# Patient Record
Sex: Female | Born: 1945 | Race: Black or African American | Hispanic: No | Smoking: Current every day smoker
Health system: Southern US, Community
[De-identification: ages and names within clinical notes are randomized; demographics above are authoritative.]

## PROBLEM LIST (undated history)

## (undated) DIAGNOSIS — D649 Anemia, unspecified: Secondary | ICD-10-CM

## (undated) DIAGNOSIS — I10 Essential (primary) hypertension: Secondary | ICD-10-CM

## (undated) DIAGNOSIS — J45909 Unspecified asthma, uncomplicated: Secondary | ICD-10-CM

## (undated) DIAGNOSIS — J449 Chronic obstructive pulmonary disease, unspecified: Secondary | ICD-10-CM

## (undated) DIAGNOSIS — E119 Type 2 diabetes mellitus without complications: Secondary | ICD-10-CM

## (undated) DIAGNOSIS — M1990 Unspecified osteoarthritis, unspecified site: Secondary | ICD-10-CM

## (undated) HISTORY — DX: Chronic obstructive pulmonary disease, unspecified: J44.9

## (undated) HISTORY — PX: BACK SURGERY: SHX140

## (undated) HISTORY — PX: KNEE ARTHROSCOPY: SUR90

## (undated) HISTORY — PX: FOOT SURGERY: SHX648

## (undated) HISTORY — DX: Type 2 diabetes mellitus without complications: E11.9

## (undated) HISTORY — PX: EYE SURGERY: SHX253

## (undated) HISTORY — DX: Essential (primary) hypertension: I10

## (undated) HISTORY — DX: Unspecified asthma, uncomplicated: J45.909

---

## 2014-09-05 ENCOUNTER — Ambulatory Visit: Payer: Medicare Other | Attending: Orthopedic Surgery | Admitting: Rehabilitation

## 2014-09-05 DIAGNOSIS — M542 Cervicalgia: Secondary | ICD-10-CM | POA: Insufficient documentation

## 2014-09-05 DIAGNOSIS — M5416 Radiculopathy, lumbar region: Secondary | ICD-10-CM | POA: Insufficient documentation

## 2014-09-05 DIAGNOSIS — G8929 Other chronic pain: Secondary | ICD-10-CM | POA: Insufficient documentation

## 2014-09-05 DIAGNOSIS — R293 Abnormal posture: Secondary | ICD-10-CM | POA: Insufficient documentation

## 2014-09-06 ENCOUNTER — Ambulatory Visit: Payer: Medicare Other | Admitting: Rehabilitation

## 2014-09-06 ENCOUNTER — Encounter: Payer: Self-pay | Admitting: Rehabilitation

## 2014-09-06 DIAGNOSIS — M5416 Radiculopathy, lumbar region: Secondary | ICD-10-CM

## 2014-09-06 DIAGNOSIS — R293 Abnormal posture: Secondary | ICD-10-CM

## 2014-09-06 DIAGNOSIS — M542 Cervicalgia: Principal | ICD-10-CM

## 2014-09-06 DIAGNOSIS — G8929 Other chronic pain: Secondary | ICD-10-CM | POA: Diagnosis not present
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
## 2014-09-13 ENCOUNTER — Ambulatory Visit: Payer: Medicare Other | Attending: Orthopedic Surgery | Admitting: Rehabilitation

## 2014-09-13 ENCOUNTER — Encounter: Payer: Self-pay | Admitting: Rehabilitation

## 2014-09-13 DIAGNOSIS — R293 Abnormal posture: Secondary | ICD-10-CM | POA: Insufficient documentation

## 2014-09-13 DIAGNOSIS — G8929 Other chronic pain: Secondary | ICD-10-CM

## 2014-09-13 DIAGNOSIS — M5416 Radiculopathy, lumbar region: Secondary | ICD-10-CM | POA: Insufficient documentation

## 2014-09-13 DIAGNOSIS — M542 Cervicalgia: Secondary | ICD-10-CM | POA: Diagnosis not present

## 2014-09-13 NOTE — Therapy (Signed)
[facility] [address]  [address] [place], [place], [address] Phone: [phone]   Fax:  [phone]  Physical Therapy Treatment  Patient Details  Name: Jaleena Viviani MRN: [id number] Date of Birth: [birth year] Referring Provider:  Myrene Galas, MD  Encounter Date: 09/13/2014      PT End of Session - 09/13/14 1145    Visit Number 2   Number of Visits 10   Date for PT Re-Evaluation 10/11/14   PT Start Time 1100   PT Stop Time 1155   PT Time Calculation (min) 55 min   Activity Tolerance Patient limited by pain      Past Medical History  Diagnosis Date  . Hypertension   . Diabetes mellitus without complication   . COPD (chronic obstructive pulmonary disease)   . Asthma     History reviewed. No pertinent past surgical history.  There were no vitals filed for this visit.  Visit Diagnosis:  Neck pain, chronic  Right lumbar radiculopathy  Bad posture      Subjective Assessment - 09/13/14 1059    Subjective "it has been a bad week" The exercises make the neck feel worse and the leg is really hurting.  Would like to look at leg and back today   Currently in Pain? Yes   Pain Score 8    Pain Location Neck   Aggravating Factors  turning the neck, using the arms   Pain Relieving Factors muscle relaxers   Multiple Pain Sites Yes   Pain Score 0  currently in the R posterior thigh up to 10/10 with walking on it            [facility] PT Assessment - 09/13/14 0001    Sensation   Additional Comments reported decreased to L2 and L4   ROM / Strength   AROM / PROM / Strength PROM   AROM   AROM Assessment Site Lumbar   Lumbar Flexion to mid shins with hamstring pull   Lumbar Extension 50% pn in glute; RMs x 10 worsening paininto leg   Lumbar - Right Side Bend to knee with pain   Lumbar - Left Side Bend to knee with pain   Lumbar - Right Rotation 70% with pain posterior thigh*   Lumbar - Left Rotation 75% with pain   PROM   Overall PROM Comments prone hip ER and IR limited due to pain and guarding   Strength   Overall Strength Comments LE myotomes weaker on the R: hip flexion, knee flexion, DF at 3+/5. R hamstring also at 3+/5. none of them painful    Palpation   Palpation unable to lie prone for hamstring and traction testing;    other    Comment increase to 10/10 pain with attempts at hooklying, traction pulls, and SKTC with numbness to the toes     back/LE eval performed Attempted supine work but unable to tolerate with increase up to 10/10 pain and Whole leg numbness Sidelying STM R piriformis, maitland rotations grade II x 60", R SLR/hamstring stretches 3x20"  Reviewed current UT/postural stretches with patient performing UT and LS stretches incorrectly which may be contributing to increased pain.  Seated IFC R lateral back/hip with MH x  Education on attempting supine lying at home to tolerance. Pt reports she has not been supine since 1999.  PT Long Term Goals - 09/06/14 1420    PT LONG TERM GOAL #1   Title independent with HEP for cervical and lumbar spine   Time 5   Period Weeks   Status New   PT LONG TERM GOAL #2   Title demonstrate correct seated posture without correction or cues needed   Time 5   Period Weeks   Status New   PT LONG TERM GOAL #3   Title decrease cervical spine pain to intermittent only   Time 5   Period Weeks   Status New               Plan - 09/13/14 1146    Clinical Impression Statement pt reports increased cervical pain and gentle postural HEP but was performing incorrectly upon inspection.  LE/LB eval today limited by inability to remain supine or prone. able to tolerate some sidelying activities. Pt presents with significantly irritable R LE radicular symptoms with decreased light touch and weakness L2/3, L4 /5.   PT Next Visit Plan progress low back LE/HEP:  supine tolerance, gentle core work  and stretching R LE seated if necessary, education on how to transfer sick mother to protect back, cervical: postural TE. pain relief for both.    Consulted and Agree with Plan of Care Patient        Problem List There are no active problems to display for this patient.   Idamae Lusherevis, Kara R 09/13/2014, 11:51 AM  [facility] [address]  [address] [place], [place], [address] Phone: [phone]   Fax:  [phone]

## 2014-09-15 ENCOUNTER — Ambulatory Visit: Payer: Medicare Other | Admitting: Rehabilitation

## 2014-09-15 ENCOUNTER — Encounter: Payer: Self-pay | Admitting: Rehabilitation

## 2014-09-15 DIAGNOSIS — R293 Abnormal posture: Secondary | ICD-10-CM

## 2014-09-15 DIAGNOSIS — M5416 Radiculopathy, lumbar region: Secondary | ICD-10-CM

## 2014-09-15 DIAGNOSIS — G8929 Other chronic pain: Secondary | ICD-10-CM

## 2014-09-15 DIAGNOSIS — M542 Cervicalgia: Secondary | ICD-10-CM | POA: Diagnosis not present

## 2014-09-15 NOTE — Therapy (Addendum)
[facility] [address]  [place], [place], [address] Phone: [phone]   Fax:  [phone]  Physical Therapy Treatment  Patient Details  Name: Robyn Mayo MRN: [id number] Date of Birth: 1945-11-17 Referring Provider:  Altamese [place], MD  Encounter Date: 09/15/2014      PT End of Session - 09/15/14 1143    Visit Number 3   Number of Visits 10   Date for PT Re-Evaluation 10/11/14   PT Start Time 1115   PT Stop Time 1155   PT Time Calculation (min) 40 min   Equipment Utilized During Treatment Gait belt   Activity Tolerance Patient tolerated treatment well      Past Medical History  Diagnosis Date  . Hypertension   . Diabetes mellitus without complication   . COPD (chronic obstructive pulmonary disease)   . Asthma     History reviewed. No pertinent past surgical history.  There were no vitals filed for this visit.  Visit Diagnosis:  Neck pain, chronic  Right lumbar radiculopathy  Bad posture      Subjective Assessment - 09/15/14 1115    Subjective Has been practicing lying supine at home.  improving this slowly.  has also been working on decreasing neck extension during seated posture.     Currently in Pain? Yes   Pain Score 7    Pain Location Neck   Pain Score 7   Pain Location Leg   Pain Orientation Right      Nustep level 5x5' UE/LE Attempted supine lumbar work/stretches:   TrA + glute contraction 4"x10  SKTC 2x20" bil  DKTC 10" x 2  LTRs 10"x5 Manual R hamstring stretch to tolerance 3x10" bil UT stretch 2x20"   Seated IFC and MH x 38min to the R glute/back                               PT Long Term Goals - 09/06/14 1420    PT LONG TERM GOAL #1   Title independent with HEP for cervical and lumbar spine   Time 5   Period Weeks   Status New   PT LONG TERM GOAL #2   Title demonstrate correct seated posture without correction or cues needed   Time 5   Period Weeks   Status New   PT LONG TERM GOAL #3   Title decrease cervical spine pain to intermittent only   Time 5   Period Weeks   Status New               Plan - 09/15/14 1144    Clinical Impression Statement pt presents able to lie supine today and with tolerance to supine activity. reports she has been practicing this at home.     PT Next Visit Plan progress low back LE/HEP:  supine tolerance, gentle core work and stretching R LE seated if necessary, education on how to transfer sick mother to protect back, cervical: postural TE. pain relief for both.    Consulted and Agree with Plan of Care Patient        Problem List There are no active problems to display for this patient.   Stark Bray, DPT< CMP 09/15/2014, 11:46 AM  [facility] [address]  [place], [place], [address] Phone: [phone]   Fax:  [phone]     PHYSICAL THERAPY DISCHARGE  SUMMARY  Visits from Start of Care: 3  Current functional level related to goals / functional outcomes: Unknown   Remaining deficits: unknown   Education / Equipment: Initial HEP Plan: Patient agrees to discharge.  Patient goals were not met. Patient is being discharged due to the patient's request.  ?????       Robyn Mayo called on 09/27/14 stating her Mother's health was very poor and that she wanted to be discharged from PT while she attended to her Mother.  We are therefore discharging Robyn Mayo from our care at this time.  Leonette Most PT, OCS 10/10/2014 3:29 PM

## 2014-09-20 ENCOUNTER — Ambulatory Visit: Payer: Medicare Other | Admitting: Rehabilitation

## 2014-09-27 ENCOUNTER — Ambulatory Visit: Payer: Medicare Other | Admitting: Rehabilitation

## 2015-11-16 ENCOUNTER — Other Ambulatory Visit: Payer: Self-pay | Admitting: Orthopedic Surgery

## 2015-11-16 DIAGNOSIS — M5416 Radiculopathy, lumbar region: Secondary | ICD-10-CM

## 2015-11-24 ENCOUNTER — Ambulatory Visit
Admission: RE | Admit: 2015-11-24 | Discharge: 2015-11-24 | Disposition: A | Discharge status: Home / Self Care | Payer: Medicare Other | Source: Ambulatory Visit | Attending: Orthopedic Surgery | Admitting: Orthopedic Surgery

## 2015-11-24 DIAGNOSIS — M5416 Radiculopathy, lumbar region: Secondary | ICD-10-CM

## 2015-11-28 ENCOUNTER — Other Ambulatory Visit: Payer: Self-pay | Admitting: Orthopedic Surgery

## 2015-11-28 DIAGNOSIS — M5416 Radiculopathy, lumbar region: Secondary | ICD-10-CM

## 2015-11-29 ENCOUNTER — Ambulatory Visit
Admission: RE | Admit: 2015-11-29 | Discharge: 2015-11-29 | Disposition: A | Discharge status: Home / Self Care | Payer: Medicare Other | Source: Ambulatory Visit | Attending: Orthopedic Surgery | Admitting: Orthopedic Surgery

## 2015-11-29 ENCOUNTER — Other Ambulatory Visit: Payer: Self-pay | Admitting: Orthopedic Surgery

## 2015-11-29 DIAGNOSIS — M5416 Radiculopathy, lumbar region: Secondary | ICD-10-CM

## 2015-11-29 MED ORDER — METHYLPREDNISOLONE ACETATE 40 MG/ML INJ SUSP (RADIOLOG
120.0000 mg | Freq: Once | INTRAMUSCULAR | Status: AC
  Administered 2015-11-29: 120 mg via EPIDURAL

## 2015-11-29 MED ORDER — IOPAMIDOL (ISOVUE-M 200) INJECTION 41%
1.0000 mL | Freq: Once | INTRAMUSCULAR | Status: AC
  Administered 2015-11-29: 1 mL via EPIDURAL

## 2015-11-29 NOTE — Discharge Instructions (Signed)

## 2016-11-07 ENCOUNTER — Other Ambulatory Visit: Payer: Self-pay | Admitting: Orthopedic Surgery

## 2016-11-07 DIAGNOSIS — M5416 Radiculopathy, lumbar region: Secondary | ICD-10-CM

## 2016-11-14 ENCOUNTER — Inpatient Hospital Stay
Admission: RE | Admit: 2016-11-14 | Discharge: 2016-11-14 | Disposition: A | Discharge status: Home / Self Care | Payer: Medicare Other | Source: Ambulatory Visit | Attending: Orthopedic Surgery | Admitting: Orthopedic Surgery

## 2016-11-19 ENCOUNTER — Ambulatory Visit
Admission: RE | Admit: 2016-11-19 | Discharge: 2016-11-19 | Disposition: A | Discharge status: Home / Self Care | Payer: Medicare Other | Source: Ambulatory Visit | Attending: Orthopedic Surgery | Admitting: Orthopedic Surgery

## 2016-11-19 DIAGNOSIS — M5416 Radiculopathy, lumbar region: Secondary | ICD-10-CM

## 2016-11-19 MED ORDER — METHYLPREDNISOLONE ACETATE 40 MG/ML INJ SUSP (RADIOLOG
120.0000 mg | Freq: Once | INTRAMUSCULAR | Status: AC
  Administered 2016-11-19: 120 mg via EPIDURAL

## 2016-11-19 MED ORDER — IOPAMIDOL (ISOVUE-M 200) INJECTION 41%
1.0000 mL | Freq: Once | INTRAMUSCULAR | Status: AC
Start: 2016-11-19 — End: 2016-11-19
  Administered 2016-11-19: 1 mL via EPIDURAL

## 2016-11-19 NOTE — Discharge Instructions (Signed)

## 2018-03-12 NOTE — H&P (Addendum)
Patient ID: Robyn Mayo MRN: [id number] DOB/AGE: 70-09-1945 70 y.o.  Admit date: (Not on file)  Admission Diagnoses:  Lumbar degenerative disc disease  HPI: Very pleasant 72 year old female pt presents to the clinic for an H&P prior to right L5-S1 Discectomy.  The pt last A1c was 6.3.  The pt has been working on smoking cessation.  She is down to 5 a day.  Past Medical History: Past Medical History:  Diagnosis Date  . Asthma   . COPD (chronic obstructive pulmonary disease)   . Diabetes mellitus without complication   . Hypertension     Surgical History: No past surgical history on file.  Family History: No family history on file.  Social History: Social History   Socioeconomic History  . Marital status: Widowed    Spouse name: Not on file  . Number of children: Not on file  . Years of education: Not on file  . Highest education level: Not on file  Occupational History  . Not on file  Social Needs  . Financial resource strain: Not on file  . Food insecurity:    Worry: Not on file    Inability: Not on file  . Transportation needs:    Medical: Not on file    Non-medical: Not on file  Tobacco Use  . Smoking status: Current Every Day Smoker    Packs/day: 0.50    Types: Cigarettes  Substance and Sexual Activity  . Alcohol use: Not on file  . Drug use: Not on file  . Sexual activity: Not on file  Lifestyle  . Physical activity:    Days per week: Not on file    Minutes per session: Not on file  . Stress: Not on file  Relationships  . Social connections:    Talks on phone: Not on file    Gets together: Not on file    Attends religious service: Not on file    Active member of club or organization: Not on file    Attends meetings of clubs or organizations: Not on file    Relationship status: Not on file  . Intimate partner violence:    Fear of current or ex partner: Not on file    Emotionally abused: Not on file    Physically abused: Not on file   Forced sexual activity: Not on file  Other Topics Concern  . Not on file  Social History Narrative  . Not on file    Allergies: Codeine; Tramadol; Penicillins; and Sulfa antibiotics  Medications: I have reviewed the patient's current medications.  Vital Signs: No data found.  Radiology: No results found.  Labs: No results for input(s): WBC, RBC, HCT, PLT in the last 72 hours. No results for input(s): NA, K, CL, CO2, BUN, CREATININE, GLUCOSE, CALCIUM in the last 72 hours. No results for input(s): LABPT, INR in the last 72 hours.  Review of Systems: ROS  Physical Exam: There is no height or weight on file to calculate BMI.  Physical Exam  Constitutional: She is oriented to person, place, and time. She appears well-developed and well-nourished.  Eyes: Pupils are equal, round, and reactive to light.  Cardiovascular: Normal rate and regular rhythm.  Respiratory: Effort normal and breath sounds normal.  GI: Soft. Bowel sounds are normal.  Neurological: She is alert and oriented to person, place, and time.  Skin: Skin is warm and dry.  Psychiatric: She has a normal mood and affect. Her behavior is normal. Judgment and  thought content normal.   Positive right straight leg raise test with reproduction of S1 pain.  No focal motor deficits on clinical exam.  Positive numbness and dysesthesias in the right S1 dermatome.  No significant back pain with palpation or range of motion.  No significant hip, knee, ankle pain with joint range of motion.  Intact peripheral pulses in the lower extremity bilaterally.  Compartments are soft and nontender.  No incontinence of bowel and bladder, abdomen soft and nontender.  Lumbar MRI: completed on 01/27/18 was reviewed with the patient.  I have also reviewed the radiology report.  Right-sided disc protrusion at L5-S1 that impinges on the descending right S1 nerve root.  Moderate narrowing of the thecal sac due to the epidural fat.  Prior surgical  change at L4-5 without spinal canal stenosis.  Mild L3-4 spinal canal stenosis.  He has low-grade degenerative anterolisthesis L3-4 and L4-5.  No significant stenosis L5-S1   Assessment and Plan: Risks and benefits of surgery were discussed with the patient. These include: Infection, bleeding, death, stroke, paralysis, ongoing or worse pain, need for additional surgery, leak of spinal fluid, adjacent segment degeneration requiring additional surgery, post-operative hematoma formation that can result in neurological compromise and the need for urgent/emergent re-operation. Loss in bowel and bladder control. Injury to major vessels that could result in the need for urgent abdominal surgery to stop bleeding. Risk of deep venous thrombosis (DVT) and the need for additional treatment. Recurrent disc herniation resulting in the need for revision surgery, which could include fusion surgery (utilizing instrumentation such as pedicle screws and intervertebral cages). Additional risk: If instrumentation is used to address spinal stenosis there is a risk of migration, or breakage of that hardware that could require additional surgery.  Goal of surgery: Reduce (not eliminate) pain, and improve quality of life.  Robyn Mayo, PAC for Robyn Lick, MD [facility] [phone]  There is been no change in the patient's clinical exam from her last office visit of 03/12/2018.  She continues to have significant right radicular S1 nerve pain, and dysesthesias.  She has a positive nerve root tension sign with reproduction of right S1 nerve pain.  I have reviewed the procedure with the patient and her family as well as the risks and benefits.  All of their questions were addressed.  Plan on moving forward with weight the L5-S1 discectomy decompression.

## 2018-03-16 ENCOUNTER — Encounter (HOSPITAL_COMMUNITY)
Admission: RE | Admit: 2018-03-16 | Discharge: 2018-03-16 | Disposition: A | Discharge status: Home / Self Care | Payer: Medicare Other | Source: Ambulatory Visit | Attending: Orthopedic Surgery | Admitting: Orthopedic Surgery

## 2018-03-16 ENCOUNTER — Encounter (HOSPITAL_COMMUNITY): Payer: Self-pay

## 2018-03-16 ENCOUNTER — Other Ambulatory Visit: Payer: Self-pay

## 2018-03-16 DIAGNOSIS — Z01818 Encounter for other preprocedural examination: Secondary | ICD-10-CM | POA: Diagnosis present

## 2018-03-16 HISTORY — DX: Anemia, unspecified: D64.9

## 2018-03-16 HISTORY — DX: Unspecified osteoarthritis, unspecified site: M19.90

## 2018-03-16 LAB — CBC
HCT: 38 % (ref 36.0–46.0)
HEMOGLOBIN: 12.3 g/dL (ref 12.0–15.0)
MCH: 31.1 pg (ref 26.0–34.0)
MCHC: 32.4 g/dL (ref 30.0–36.0)
MCV: 96 fL (ref 80.0–100.0)
NRBC: 0 % (ref 0.0–0.2)
Platelets: 273 10*3/uL (ref 150–400)
RBC: 3.96 MIL/uL (ref 3.87–5.11)
RDW: 13.2 % (ref 11.5–15.5)
WBC: 6.9 10*3/uL (ref 4.0–10.5)

## 2018-03-16 LAB — BASIC METABOLIC PANEL
Anion gap: 9 (ref 5–15)
BUN: 15 mg/dL (ref 8–23)
CHLORIDE: 110 mmol/L (ref 98–111)
CO2: 23 mmol/L (ref 22–32)
CREATININE: 0.93 mg/dL (ref 0.44–1.00)
Calcium: 9.8 mg/dL (ref 8.9–10.3)
GFR calc Af Amer: >60 mL/min (ref >60)
GFR calc non Af Amer: >60 mL/min (ref >60)
Glucose, Bld: 109 mg/dL — ABNORMAL HIGH (ref 70–99)
Potassium: 3.7 mmol/L (ref 3.5–5.1)
Sodium: 142 mmol/L (ref 135–145)

## 2018-03-16 LAB — SURGICAL PCR SCREEN
MRSA, PCR: NEGATIVE
Staphylococcus aureus: NEGATIVE

## 2018-03-16 LAB — HEMOGLOBIN A1C
Hgb A1c MFr Bld: 6.7 % — ABNORMAL HIGH (ref 4.8–5.6)
Mean Plasma Glucose: 145.59 mg/dL

## 2018-03-16 LAB — GLUCOSE, CAPILLARY: GLUCOSE-CAPILLARY: 92 mg/dL (ref 70–99)

## 2018-03-16 NOTE — Progress Notes (Signed)
PCP  Dannielle Karvonen  FNP  Pt. Cleared for surgery  See clearance note in Epic  Checks CBG 2 times a day most of time has not been checking regularly since back problems began

## 2018-03-16 NOTE — Pre-Procedure Instructions (Signed)
Robyn Mayo  03/16/2018      [id number] - [place], [place] - [address] [place] [address] Phone: [phone] Fax: [phone]    Your procedure is scheduled on 03-24-2018 Wednesday .  Report to [facility] Admitting at 6:30 A.M.   Call this number if you have problems the morning of surgery:  [phone]   Remember:  Do not eat food or drink liquids after midnight.                          Take these medicines the morning of surgery with A SIP OF WATER   Tylenol if needed Advair Diskus Amlodipine(Norvasc) Gabapentin(Neurontin) Simvastatin(Zocor)   STOP TAKING ANY ASPIRIN (UNLESS OTHERWISE INSTRUCTED BY YOUR SURGEON),ANTIINFLAMATORIES (IBUPROFEN,ALEVE,MOTRIN,ADVIL,GOODY'S POWDERS),HERBAL SUPPLEMENTS,FISH OIL,AND VITAMINS 5-7 DAYS PRIOR TO SURGERY     How to Manage Your Diabetes Before and After Surgery  Why is it important to control my blood sugar before and after surgery? . Improving blood sugar levels before and after surgery helps healing and can limit problems. . A way of improving blood sugar control is eating a healthy diet by: o  Eating less sugar and carbohydrates o  Increasing activity/exercise o  Talking with your doctor about reaching your blood sugar goals . High blood sugars (greater than 180 mg/dL) can raise your risk of infections and slow your recovery, so you will need to focus on controlling your diabetes during the weeks before surgery. . Make sure that the doctor who takes care of your diabetes knows about your planned surgery including the date and location.  How do I manage my blood sugar before surgery? . Check your blood sugar at least 4 times a day, starting 2 days before surgery, to make sure that the level is not too high or low. o Check your blood sugar the morning of your surgery when you wake up and every 2 hours until you get to the Short Stay unit. . If  your blood sugar is less than 70 mg/dL, you will need to treat for low blood sugar: o Do not take insulin. o Treat a low blood sugar (less than 70 mg/dL) with  cup of clear juice (cranberry or apple), 4 glucose tablets, OR glucose gel. Recheck blood sugar in 15 minutes after treatment (to make sure it is greater than 70 mg/dL). If your blood sugar is not greater than 70 mg/dL on recheck, call [phone] o  for further instructions. . Report your blood sugar to the short stay nurse when you get to Short Stay.  . If you are admitted to the hospital after surgery: o Your blood sugar will be checked by the staff and you will probably be given insulin after surgery (instead of oral diabetes medicines) to make sure you have good blood sugar levels. o The goal for blood sugar control after surgery is 80-180 mg/dL.              WHAT DO I DO ABOUT MY DIABETES MEDICATION?   Marland Kitchen Do not take oral diabetes medicines (pills) the morning of surgery.  Metformin(Glucophage) Pioglitazone(Actos) .        .   . The day of surgery, do not take other diabetes injectables, including Byetta (exenatide), Bydureon (exenatide ER), Victoza (liraglutide), or Trulicity (dulaglutide).  .   Other Instructions:          :  Date:   :  Date:   Reviewed and Endorsed by [facility] Patient Education Committee, August 2015           Do not wear jewelry, make-up or nail polish.  Do not wear lotions, powders, or perfumes, or deodorant.  Do not shave 48 hours prior to surgery  Do not bring valuables to the hospital.  [facility] is not responsible for any belongings or valuables.  Contacts, dentures or bridgework may not be worn into surgery.  Leave your suitcase in the car.  After surgery it may be brought to your room.  For patients admitted to the hospital, discharge time will be determined by your treatment team.  Patients discharged the day of surgery will not be allowed to drive home.      [facility] - Preparing for Surgery  Before surgery, you can play an important role.  Because skin is not sterile, your skin needs to be as free of germs as possible.  You can reduce the number of germs on you skin by washing with CHG (chlorahexidine gluconate) soap before surgery.  CHG is an antiseptic cleaner which kills germs and bonds with the skin to continue killing germs even after washing.  Oral Hygiene is also important in reducing the risk of infection.  Remember to brush your teeth with your regular toothpaste the morning of surgery.  Please DO NOT use if you have an allergy to CHG or antibacterial soaps.  If your skin becomes reddened/irritated stop using the CHG and inform your nurse when you arrive at Short Stay.  Do not shave (including legs and underarms) for at least 48 hours prior to the first CHG shower.  You may shave your face.  Please follow these instructions carefully:   1.  Shower with CHG Soap the night before surgery and the morning of Surgery.  2.  If you choose to wash your hair, wash your hair first as usual with your normal shampoo.  3.  After you shampoo, rinse your hair and body thoroughly to remove the shampoo. 4.  Use CHG as you would any other liquid soap.  You can apply chg directly to the skin and wash gently with a      scrungie or washcloth.           5.  Apply the CHG Soap to your body ONLY FROM THE NECK DOWN.   Do not use on open wounds or open sores. Avoid contact with your eyes, ears, mouth and genitals (private parts).  Wash genitals (private parts) with your normal soap.  6.  Wash thoroughly, paying special attention to the area where your surgery will be performed.  7.  Thoroughly rinse your body with warm water from the neck down.  8.  DO NOT shower/wash with your normal soap after using and rinsing off the CHG Soap.  9.  Pat yourself dry with a clean towel.            10.  Wear clean pajamas.            11.  Place clean sheets on your bed  the night of your first shower and do not sleep with pets.  Day of Surgery  Do not apply any lotions/deoderants the morning of surgery.   Please wear clean clothes to the hospital/surgery center. Remember to brush your teeth with toothpaste.    Please read over the following fact sheets that you were given. Pain Booklet and Surgical  Site Infection Prevention

## 2018-03-17 NOTE — Progress Notes (Signed)
Anesthesia Chart Review:  Case:  [id number] Date/Time:  03/24/18 0815   Procedure:  Right L5-S1 disectomy (Right ) - 120 mins   Anesthesia type:  General   Pre-op diagnosis:  L5-S1 right HNP   Location:  [facility] ROOM 04 / [facility] OR   Surgeon:  Venita Lick, MD      DISCUSSION: 72 yo female current smoker. Pertinent hx includes DMII, HTN, COPD, Asthma, Anemia.   Preop clearance in care everywhere by Dannielle Karvonen 03/15/2018 states:   "She is scheduled for lumbar surgery next week. She has had back surgery and other orthopedic surgeries in the past and tolerated anesthesia well with no complications per patient. She did well on recovery as well. Her daughters are coming to be with her during and after surgery. She has cut back on her smoking to 5 cigarettes daily and plans to quit completely. She has not yet had her PFTs ordered in September as it appears no one called to schedule her. There was need to determine if she is asthmatic. Diabetes is controlled. BP controlled.  1. Pre-operative clearance - cleared - PFTs to be scheduled - encouraged smoking cessation - labs scheduled"  Anticipate she can proceed as planned barring acute status change.   VS: BP 122/65   Pulse 84   Temp 36.4 C (Oral)   Resp 18   Ht 5\' 1"  (1.549 m)   Wt 77.8 kg   SpO2 100%   BMI 32.42 kg/m   PROVIDERS: Cousins, Caleen Jobs, FNP is PCP   LABS: Labs reviewed: Acceptable for surgery. (all labs ordered are listed, but only abnormal results are displayed)  Labs Reviewed  BASIC METABOLIC PANEL - Abnormal; Notable for the following components:      Result Value   Glucose, Bld 109 (*)    All other components within normal limits  HEMOGLOBIN A1C - Abnormal; Notable for the following components:   Hgb A1c MFr Bld 6.7 (*)    All other components within normal limits  SURGICAL PCR SCREEN  GLUCOSE, CAPILLARY  CBC     IMAGES: N/A   EKG: 03/15/2018 (outside record, copy on pt chart): Sinus rhythm rate 95  with frequent PVCs.  CV: N/A  Past Medical History:  Diagnosis Date  . Anemia   . Arthritis   . Asthma   . COPD (chronic obstructive pulmonary disease) (HCC)   . Diabetes mellitus without complication (HCC)   . Hypertension     Past Surgical History:  Procedure Laterality Date  . BACK SURGERY    . EYE SURGERY Bilateral     as child  cross-eyed  . FOOT SURGERY Right    times 2  . KNEE ARTHROSCOPY Right     MEDICATIONS: . fluticasone-salmeterol (ADVAIR HFA) 115-21 MCG/ACT inhaler  . acetaminophen (TYLENOL) 650 MG CR tablet  . amLODipine (NORVASC) 10 MG tablet  . cholecalciferol (VITAMIN D) 1000 units tablet  . enalapril (VASOTEC) 20 MG tablet  . gabapentin (NEURONTIN) 100 MG capsule  . Menthol-Camphor [facility] WARM THERAPY) 3-3 % GEL  . metFORMIN (GLUCOPHAGE) 500 MG tablet  . montelukast (SINGULAIR) 10 MG tablet  . pioglitazone (ACTOS) 30 MG tablet  . simvastatin (ZOCOR) 40 MG tablet   No current facility-administered medications for this encounter.     [facility] Short Stay Center/Anesthesiology Phone [phone] 03/17/2018 12:21 PM

## 2018-03-23 NOTE — Anesthesia Preprocedure Evaluation (Addendum)
Anesthesia Evaluation  Patient identified by MRN, date of birth, ID band Patient awake    Reviewed: Allergy & Precautions, NPO status , Patient's Chart, lab work & pertinent test results  Airway Mallampati: II  TM Distance: >3 FB Neck ROM: Full  Mouth opening: Limited Mouth Opening  Dental no notable dental hx. (+) Edentulous Upper, Edentulous Lower, Dental Advisory Given   Pulmonary asthma , COPD,  COPD inhaler, Current Smoker,    Pulmonary exam normal breath sounds clear to auscultation       Cardiovascular hypertension, Pt. on medications Normal cardiovascular exam Rhythm:Regular Rate:Normal     Neuro/Psych negative neurological ROS  negative psych ROS   GI/Hepatic negative GI ROS, Neg liver ROS,   Endo/Other  diabetes, Type 2, Oral Hypoglycemic Agents  Renal/GU negative Renal ROS  negative genitourinary   Musculoskeletal  (+) Arthritis , Osteoarthritis,    Abdominal   Peds  Hematology  (+) Blood dyscrasia, anemia ,   Anesthesia Other Findings   Reproductive/Obstetrics                           Anesthesia Physical Anesthesia Plan  ASA: III  Anesthesia Plan: General   Post-op Pain Management:    Induction: Intravenous  PONV Risk Score and Plan: 2 and Dexamethasone and Ondansetron  Airway Management Planned: Oral ETT and Video Laryngoscope Planned  Additional Equipment:   Intra-op Plan:   Post-operative Plan: Extubation in OR  Informed Consent: I have reviewed the patients History and Physical, chart, labs and discussed the procedure including the risks, benefits and alternatives for the proposed anesthesia with the patient or authorized representative who has indicated his/her understanding and acceptance.   Dental advisory given  Plan Discussed with: CRNA  Anesthesia Plan Comments:       Anesthesia Quick Evaluation

## 2018-03-24 ENCOUNTER — Other Ambulatory Visit: Payer: Self-pay

## 2018-03-24 ENCOUNTER — Ambulatory Visit (HOSPITAL_COMMUNITY): Payer: Medicare Other

## 2018-03-24 ENCOUNTER — Ambulatory Visit (HOSPITAL_COMMUNITY): Payer: Medicare Other | Admitting: Physician Assistant

## 2018-03-24 ENCOUNTER — Ambulatory Visit (HOSPITAL_COMMUNITY)
Admission: RE | Disposition: A | Discharge status: Home Health | Payer: Self-pay | Source: Ambulatory Visit | Attending: Orthopedic Surgery

## 2018-03-24 ENCOUNTER — Ambulatory Visit (HOSPITAL_COMMUNITY): Payer: Medicare Other | Admitting: Certified Registered Nurse Anesthetist

## 2018-03-24 ENCOUNTER — Encounter (HOSPITAL_COMMUNITY): Payer: Self-pay

## 2018-03-24 ENCOUNTER — Observation Stay (HOSPITAL_COMMUNITY)
Admission: RE | Admit: 2018-03-24 | Discharge: 2018-03-25 | Disposition: A | Discharge status: Home Health | Payer: Medicare Other | Source: Ambulatory Visit | Attending: Orthopedic Surgery | Admitting: Orthopedic Surgery

## 2018-03-24 DIAGNOSIS — Z880 Allergy status to penicillin: Secondary | ICD-10-CM | POA: Diagnosis not present

## 2018-03-24 DIAGNOSIS — E119 Type 2 diabetes mellitus without complications: Secondary | ICD-10-CM | POA: Diagnosis not present

## 2018-03-24 DIAGNOSIS — Z888 Allergy status to other drugs, medicaments and biological substances status: Secondary | ICD-10-CM | POA: Diagnosis not present

## 2018-03-24 DIAGNOSIS — M5136 Other intervertebral disc degeneration, lumbar region: Secondary | ICD-10-CM | POA: Diagnosis not present

## 2018-03-24 DIAGNOSIS — M5127 Other intervertebral disc displacement, lumbosacral region: Secondary | ICD-10-CM | POA: Diagnosis present

## 2018-03-24 DIAGNOSIS — F17210 Nicotine dependence, cigarettes, uncomplicated: Secondary | ICD-10-CM | POA: Diagnosis not present

## 2018-03-24 DIAGNOSIS — Z882 Allergy status to sulfonamides status: Secondary | ICD-10-CM | POA: Diagnosis not present

## 2018-03-24 DIAGNOSIS — Z885 Allergy status to narcotic agent status: Secondary | ICD-10-CM | POA: Diagnosis not present

## 2018-03-24 DIAGNOSIS — Z79899 Other long term (current) drug therapy: Secondary | ICD-10-CM | POA: Diagnosis not present

## 2018-03-24 DIAGNOSIS — Z9889 Other specified postprocedural states: Secondary | ICD-10-CM

## 2018-03-24 DIAGNOSIS — Z7984 Long term (current) use of oral hypoglycemic drugs: Secondary | ICD-10-CM | POA: Diagnosis not present

## 2018-03-24 DIAGNOSIS — J449 Chronic obstructive pulmonary disease, unspecified: Secondary | ICD-10-CM | POA: Diagnosis not present

## 2018-03-24 DIAGNOSIS — I10 Essential (primary) hypertension: Secondary | ICD-10-CM | POA: Diagnosis not present

## 2018-03-24 DIAGNOSIS — Z419 Encounter for procedure for purposes other than remedying health state, unspecified: Secondary | ICD-10-CM

## 2018-03-24 HISTORY — PX: LUMBAR LAMINECTOMY/DECOMPRESSION MICRODISCECTOMY: SHX5026

## 2018-03-24 LAB — GLUCOSE, CAPILLARY
GLUCOSE-CAPILLARY: 161 mg/dL — AB (ref 70–99)
GLUCOSE-CAPILLARY: 162 mg/dL — AB (ref 70–99)
Glucose-Capillary: 103 mg/dL — ABNORMAL HIGH (ref 70–99)
Glucose-Capillary: 219 mg/dL — ABNORMAL HIGH (ref 70–99)

## 2018-03-24 SURGERY — LUMBAR LAMINECTOMY/DECOMPRESSION MICRODISCECTOMY 1 LEVEL
Anesthesia: General | Laterality: Right

## 2018-03-24 MED ORDER — SODIUM CHLORIDE 0.9% FLUSH: 3.0000 mL | INTRAVENOUS | Status: DC | PRN

## 2018-03-24 MED ORDER — HEMOSTATIC AGENTS (NO CHARGE) OPTIME
TOPICAL | Status: DC | PRN
  Administered 2018-03-24: 1 via TOPICAL

## 2018-03-24 MED ORDER — PHENYLEPHRINE 40 MCG/ML (10ML) SYRINGE FOR IV PUSH (FOR BLOOD PRESSURE SUPPORT)
PREFILLED_SYRINGE | INTRAVENOUS | Status: AC
  Filled 2018-03-24: qty 10

## 2018-03-24 MED ORDER — 0.9 % SODIUM CHLORIDE (POUR BTL) OPTIME
TOPICAL | Status: DC | PRN
  Administered 2018-03-24: 1000 mL

## 2018-03-24 MED ORDER — LACTATED RINGERS IV SOLN
INTRAVENOUS | Status: DC
  Administered 2018-03-24 (×2): via INTRAVENOUS

## 2018-03-24 MED ORDER — METHOCARBAMOL 1000 MG/10ML IJ SOLN
500.0000 mg | Freq: Four times a day (QID) | INTRAVENOUS | Status: DC | PRN
  Filled 2018-03-24: qty 5

## 2018-03-24 MED ORDER — POLYETHYLENE GLYCOL 3350 17 G PO PACK: 17.0000 g | PACK | Freq: Every day | ORAL | Status: DC | PRN

## 2018-03-24 MED ORDER — ONDANSETRON 4 MG PO TBDP: 4.0000 mg | ORAL_TABLET | Freq: Three times a day (TID) | ORAL | 0 refills | Status: AC | PRN

## 2018-03-24 MED ORDER — METFORMIN HCL 500 MG PO TABS
500.0000 mg | ORAL_TABLET | Freq: Two times a day (BID) | ORAL | Status: DC
  Administered 2018-03-24 – 2018-03-25 (×2): 500 mg via ORAL
  Filled 2018-03-24 (×2): qty 1

## 2018-03-24 MED ORDER — METHOCARBAMOL 500 MG PO TABS
500.0000 mg | ORAL_TABLET | Freq: Four times a day (QID) | ORAL | Status: DC | PRN
  Administered 2018-03-24 – 2018-03-25 (×3): 500 mg via ORAL
  Filled 2018-03-24 (×3): qty 1

## 2018-03-24 MED ORDER — PHENOL 1.4 % MT LIQD: 1.0000 | OROMUCOSAL | Status: DC | PRN

## 2018-03-24 MED ORDER — SODIUM CHLORIDE 0.9 % IV SOLN
INTRAVENOUS | Status: DC | PRN
  Administered 2018-03-24: 40 ug/min via INTRAVENOUS

## 2018-03-24 MED ORDER — ENALAPRIL MALEATE 20 MG PO TABS
20.0000 mg | ORAL_TABLET | Freq: Every day | ORAL | Status: DC
  Administered 2018-03-24 – 2018-03-25 (×2): 20 mg via ORAL
  Filled 2018-03-24 (×2): qty 1

## 2018-03-24 MED ORDER — EPHEDRINE 5 MG/ML INJ
INTRAVENOUS | Status: AC
  Filled 2018-03-24: qty 10

## 2018-03-24 MED ORDER — OXYCODONE-ACETAMINOPHEN 5-325 MG PO TABS: 1.0000 | ORAL_TABLET | ORAL | 0 refills | Status: AC | PRN

## 2018-03-24 MED ORDER — LIDOCAINE 2% (20 MG/ML) 5 ML SYRINGE
INTRAMUSCULAR | Status: DC | PRN
  Administered 2018-03-24: 100 mg via INTRAVENOUS

## 2018-03-24 MED ORDER — INSULIN ASPART 100 UNIT/ML ~~LOC~~ SOLN
0.0000 [IU] | Freq: Three times a day (TID) | SUBCUTANEOUS | Status: DC
  Administered 2018-03-24 – 2018-03-25 (×2): 3 [IU] via SUBCUTANEOUS

## 2018-03-24 MED ORDER — OXYCODONE HCL 5 MG PO TABS
10.0000 mg | ORAL_TABLET | ORAL | Status: DC | PRN
  Administered 2018-03-24 – 2018-03-25 (×6): 10 mg via ORAL
  Filled 2018-03-24 (×6): qty 2

## 2018-03-24 MED ORDER — MIDAZOLAM HCL 2 MG/2ML IJ SOLN
INTRAMUSCULAR | Status: AC
  Filled 2018-03-24: qty 2

## 2018-03-24 MED ORDER — THROMBIN (RECOMBINANT) 20000 UNITS EX SOLR
CUTANEOUS | Status: AC
  Filled 2018-03-24: qty 20000

## 2018-03-24 MED ORDER — PROPOFOL 10 MG/ML IV BOLUS
INTRAVENOUS | Status: DC | PRN
  Administered 2018-03-24: 100 mg via INTRAVENOUS

## 2018-03-24 MED ORDER — THROMBIN 20000 UNITS EX SOLR
CUTANEOUS | Status: DC | PRN
  Administered 2018-03-24: 09:00:00 via TOPICAL

## 2018-03-24 MED ORDER — NON FORMULARY: 250.0000 ug | Freq: Two times a day (BID) | Status: DC

## 2018-03-24 MED ORDER — ONDANSETRON HCL 4 MG PO TABS: 4.0000 mg | ORAL_TABLET | Freq: Four times a day (QID) | ORAL | Status: DC | PRN

## 2018-03-24 MED ORDER — ONDANSETRON HCL 4 MG/2ML IJ SOLN
4.0000 mg | Freq: Four times a day (QID) | INTRAMUSCULAR | Status: DC | PRN
  Administered 2018-03-24: 4 mg via INTRAVENOUS
  Filled 2018-03-24: qty 2

## 2018-03-24 MED ORDER — FENTANYL CITRATE (PF) 100 MCG/2ML IJ SOLN
INTRAMUSCULAR | Status: DC | PRN
  Administered 2018-03-24 (×2): 50 ug via INTRAVENOUS
  Administered 2018-03-24: 100 ug via INTRAVENOUS
  Administered 2018-03-24: 50 ug via INTRAVENOUS

## 2018-03-24 MED ORDER — METHOCARBAMOL 500 MG PO TABS: 500.0000 mg | ORAL_TABLET | Freq: Three times a day (TID) | ORAL | 0 refills | Status: AC

## 2018-03-24 MED ORDER — SIMVASTATIN 20 MG PO TABS
40.0000 mg | ORAL_TABLET | Freq: Every day | ORAL | Status: DC
  Administered 2018-03-24 – 2018-03-25 (×2): 40 mg via ORAL
  Filled 2018-03-24 (×3): qty 2

## 2018-03-24 MED ORDER — PHENYLEPHRINE 40 MCG/ML (10ML) SYRINGE FOR IV PUSH (FOR BLOOD PRESSURE SUPPORT)
PREFILLED_SYRINGE | INTRAVENOUS | Status: DC | PRN
  Administered 2018-03-24: 80 ug via INTRAVENOUS
  Administered 2018-03-24 (×2): 120 ug via INTRAVENOUS

## 2018-03-24 MED ORDER — MORPHINE SULFATE (PF) 2 MG/ML IV SOLN: 1.0000 mg | INTRAVENOUS | Status: DC | PRN

## 2018-03-24 MED ORDER — DOCUSATE SODIUM 100 MG PO CAPS
100.0000 mg | ORAL_CAPSULE | Freq: Two times a day (BID) | ORAL | Status: DC
  Administered 2018-03-24 – 2018-03-25 (×3): 100 mg via ORAL
  Filled 2018-03-24 (×3): qty 1

## 2018-03-24 MED ORDER — SUGAMMADEX SODIUM 200 MG/2ML IV SOLN
INTRAVENOUS | Status: DC | PRN
  Administered 2018-03-24: 160 mg via INTRAVENOUS

## 2018-03-24 MED ORDER — ACETAMINOPHEN 650 MG RE SUPP: 650.0000 mg | RECTAL | Status: DC | PRN

## 2018-03-24 MED ORDER — ACETAMINOPHEN 325 MG PO TABS: 650.0000 mg | ORAL_TABLET | ORAL | Status: DC | PRN

## 2018-03-24 MED ORDER — FENTANYL CITRATE (PF) 250 MCG/5ML IJ SOLN
INTRAMUSCULAR | Status: AC
  Filled 2018-03-24: qty 5

## 2018-03-24 MED ORDER — SODIUM CHLORIDE 0.9 % IV SOLN: 250.0000 mL | INTRAVENOUS | Status: DC

## 2018-03-24 MED ORDER — BUPIVACAINE-EPINEPHRINE 0.25% -1:200000 IJ SOLN
INTRAMUSCULAR | Status: DC | PRN
  Administered 2018-03-24: 10 mL

## 2018-03-24 MED ORDER — ACETAMINOPHEN 10 MG/ML IV SOLN
INTRAVENOUS | Status: DC | PRN
  Administered 2018-03-24: 1000 mg via INTRAVENOUS

## 2018-03-24 MED ORDER — MIDAZOLAM HCL 2 MG/2ML IJ SOLN
INTRAMUSCULAR | Status: DC | PRN
  Administered 2018-03-24: 2 mg via INTRAVENOUS

## 2018-03-24 MED ORDER — AMLODIPINE BESYLATE 5 MG PO TABS
10.0000 mg | ORAL_TABLET | Freq: Every day | ORAL | Status: DC
  Administered 2018-03-25: 10 mg via ORAL
  Filled 2018-03-24 (×2): qty 2

## 2018-03-24 MED ORDER — HYDROCODONE-ACETAMINOPHEN 10-325 MG PO TABS: 1.0000 | ORAL_TABLET | ORAL | Status: DC | PRN

## 2018-03-24 MED ORDER — MONTELUKAST SODIUM 10 MG PO TABS
10.0000 mg | ORAL_TABLET | Freq: Every day | ORAL | Status: DC
  Administered 2018-03-24: 10 mg via ORAL
  Filled 2018-03-24: qty 1

## 2018-03-24 MED ORDER — VANCOMYCIN HCL IN DEXTROSE 1-5 GM/200ML-% IV SOLN
1000.0000 mg | INTRAVENOUS | Status: AC
  Administered 2018-03-24: 1000 mg via INTRAVENOUS

## 2018-03-24 MED ORDER — PIOGLITAZONE HCL 30 MG PO TABS
30.0000 mg | ORAL_TABLET | Freq: Every day | ORAL | Status: DC
  Administered 2018-03-24 – 2018-03-25 (×2): 30 mg via ORAL
  Filled 2018-03-24 (×2): qty 1

## 2018-03-24 MED ORDER — ACETAMINOPHEN 10 MG/ML IV SOLN
INTRAVENOUS | Status: AC
  Filled 2018-03-24: qty 100

## 2018-03-24 MED ORDER — BUPIVACAINE-EPINEPHRINE (PF) 0.25% -1:200000 IJ SOLN
INTRAMUSCULAR | Status: AC
  Filled 2018-03-24: qty 30

## 2018-03-24 MED ORDER — MAGNESIUM CITRATE PO SOLN: 1.0000 | Freq: Once | ORAL | Status: DC | PRN

## 2018-03-24 MED ORDER — VANCOMYCIN HCL IN DEXTROSE 750-5 MG/150ML-% IV SOLN
750.0000 mg | Freq: Once | INTRAVENOUS | Status: AC
  Administered 2018-03-24: 750 mg via INTRAVENOUS
  Filled 2018-03-24: qty 150

## 2018-03-24 MED ORDER — HYDROMORPHONE HCL 1 MG/ML IJ SOLN
0.2500 mg | INTRAMUSCULAR | Status: DC | PRN
  Administered 2018-03-24 (×2): 0.5 mg via INTRAVENOUS

## 2018-03-24 MED ORDER — VANCOMYCIN HCL IN DEXTROSE 1-5 GM/200ML-% IV SOLN
INTRAVENOUS | Status: AC
  Administered 2018-03-24: 1000 mg via INTRAVENOUS
  Filled 2018-03-24: qty 200

## 2018-03-24 MED ORDER — ROCURONIUM BROMIDE 10 MG/ML (PF) SYRINGE
PREFILLED_SYRINGE | INTRAVENOUS | Status: DC | PRN
  Administered 2018-03-24: 50 mg via INTRAVENOUS

## 2018-03-24 MED ORDER — SODIUM CHLORIDE 0.9% FLUSH: 3.0000 mL | Freq: Two times a day (BID) | INTRAVENOUS | Status: DC

## 2018-03-24 MED ORDER — GABAPENTIN 100 MG PO CAPS
100.0000 mg | ORAL_CAPSULE | Freq: Three times a day (TID) | ORAL | Status: DC
  Administered 2018-03-24 – 2018-03-25 (×3): 100 mg via ORAL
  Filled 2018-03-24 (×3): qty 1

## 2018-03-24 MED ORDER — DEXAMETHASONE SODIUM PHOSPHATE 10 MG/ML IJ SOLN
INTRAMUSCULAR | Status: DC | PRN
  Administered 2018-03-24: 5 mg via INTRAVENOUS

## 2018-03-24 MED ORDER — MENTHOL 3 MG MT LOZG: 1.0000 | LOZENGE | OROMUCOSAL | Status: DC | PRN

## 2018-03-24 MED ORDER — HYDROMORPHONE HCL 1 MG/ML IJ SOLN
INTRAMUSCULAR | Status: AC
  Filled 2018-03-24: qty 1

## 2018-03-24 MED ORDER — INSULIN ASPART 100 UNIT/ML ~~LOC~~ SOLN
0.0000 [IU] | SUBCUTANEOUS | Status: DC
  Administered 2018-03-24: 5 [IU] via SUBCUTANEOUS

## 2018-03-24 MED ORDER — LIDOCAINE 2% (20 MG/ML) 5 ML SYRINGE
INTRAMUSCULAR | Status: AC
  Filled 2018-03-24: qty 5

## 2018-03-24 MED ORDER — SUCCINYLCHOLINE CHLORIDE 200 MG/10ML IV SOSY
PREFILLED_SYRINGE | INTRAVENOUS | Status: AC
  Filled 2018-03-24: qty 10

## 2018-03-24 MED ORDER — ROCURONIUM BROMIDE 50 MG/5ML IV SOSY
PREFILLED_SYRINGE | INTRAVENOUS | Status: AC
  Filled 2018-03-24: qty 5

## 2018-03-24 MED ORDER — ONDANSETRON HCL 4 MG/2ML IJ SOLN
INTRAMUSCULAR | Status: DC | PRN
  Administered 2018-03-24: 4 mg via INTRAVENOUS

## 2018-03-24 MED ORDER — LACTATED RINGERS IV SOLN: INTRAVENOUS | Status: DC

## 2018-03-24 SURGICAL SUPPLY — 62 items
BNDG GAUZE ELAST 4 BULKY (GAUZE/BANDAGES/DRESSINGS) ×2 IMPLANT
BUR EGG ELITE 4.0 (BURR) ×2 IMPLANT
BUR MATCHSTICK NEURO 3.0 LAGG (BURR) ×2 IMPLANT
CANISTER SUCT 3000ML PPV (MISCELLANEOUS) ×2 IMPLANT
CLSR STERI-STRIP ANTIMIC 1/2X4 (GAUZE/BANDAGES/DRESSINGS) ×2 IMPLANT
CORD BI POLAR (MISCELLANEOUS) IMPLANT
COVER SURGICAL LIGHT HANDLE (MISCELLANEOUS) IMPLANT
COVER WAND RF STERILE (DRAPES) ×2 IMPLANT
DRAIN CHANNEL 15F RND FF W/TCR (WOUND CARE) IMPLANT
DRAPE POUCH INSTRU U-SHP 10X18 (DRAPES) ×2 IMPLANT
DRAPE SURG 17X23 STRL (DRAPES) IMPLANT
DRAPE U-SHAPE 47X51 STRL (DRAPES) ×2 IMPLANT
DRSG OPSITE POSTOP 3X4 (GAUZE/BANDAGES/DRESSINGS) IMPLANT
DRSG OPSITE POSTOP 4X6 (GAUZE/BANDAGES/DRESSINGS) ×2 IMPLANT
DURAPREP 26ML APPLICATOR (WOUND CARE) ×2 IMPLANT
ELECT BLADE 4.0 EZ CLEAN MEGAD (MISCELLANEOUS)
ELECT CAUTERY BLADE 6.4 (BLADE) ×2 IMPLANT
ELECT PENCIL ROCKER SW 15FT (MISCELLANEOUS) IMPLANT
ELECT REM PT RETURN 9FT ADLT (ELECTROSURGICAL) ×2
ELECTRODE BLDE 4.0 EZ CLN MEGD (MISCELLANEOUS) IMPLANT
ELECTRODE REM PT RTRN 9FT ADLT (ELECTROSURGICAL) ×1 IMPLANT
EVACUATOR SILICONE 100CC (DRAIN) IMPLANT
FLOSEAL 10ML (HEMOSTASIS) IMPLANT
GLOVE BIO SURGEON STRL SZ 6.5 (GLOVE) ×2 IMPLANT
GLOVE BIOGEL PI IND STRL 6.5 (GLOVE) ×1 IMPLANT
GLOVE BIOGEL PI IND STRL 8.5 (GLOVE) ×1 IMPLANT
GLOVE BIOGEL PI INDICATOR 6.5 (GLOVE) ×1
GLOVE BIOGEL PI INDICATOR 8.5 (GLOVE) ×1
GLOVE SS BIOGEL STRL SZ 8.5 (GLOVE) ×1 IMPLANT
GLOVE SUPERSENSE BIOGEL SZ 8.5 (GLOVE) ×1
GOWN STRL REUS W/ TWL LRG LVL3 (GOWN DISPOSABLE) ×2 IMPLANT
GOWN STRL REUS W/TWL 2XL LVL3 (GOWN DISPOSABLE) ×2 IMPLANT
GOWN STRL REUS W/TWL LRG LVL3 (GOWN DISPOSABLE) ×2
KIT BASIN OR (CUSTOM PROCEDURE TRAY) ×2 IMPLANT
KIT TURNOVER KIT B (KITS) ×2 IMPLANT
NEEDLE 22X1 1/2 (OR ONLY) (NEEDLE) ×2 IMPLANT
NEEDLE SPNL 18GX3.5 QUINCKE PK (NEEDLE) ×4 IMPLANT
NS IRRIG 1000ML POUR BTL (IV SOLUTION) ×2 IMPLANT
PACK LAMINECTOMY ORTHO (CUSTOM PROCEDURE TRAY) IMPLANT
PACK UNIVERSAL I (CUSTOM PROCEDURE TRAY) ×2 IMPLANT
PAD ARMBOARD 7.5X6 YLW CONV (MISCELLANEOUS) ×6 IMPLANT
PATTIES SURGICAL .5 X.5 (GAUZE/BANDAGES/DRESSINGS) ×2 IMPLANT
PATTIES SURGICAL .5 X1 (DISPOSABLE) ×2 IMPLANT
SPONGE LAP 4X18 RFD (DISPOSABLE) ×2 IMPLANT
SPONGE SURGIFOAM ABS GEL 100 (HEMOSTASIS) IMPLANT
SURGIFLO W/THROMBIN 8M KIT (HEMOSTASIS) ×2 IMPLANT
SUT BONE WAX W31G (SUTURE) ×2 IMPLANT
SUT MON AB 3-0 SH 27 (SUTURE) ×1
SUT MON AB 3-0 SH27 (SUTURE) ×1 IMPLANT
SUT VIC AB 0 CT1 27 (SUTURE) ×1
SUT VIC AB 0 CT1 27XBRD ANBCTR (SUTURE) ×1 IMPLANT
SUT VIC AB 1 CT1 18XCR BRD 8 (SUTURE) ×1 IMPLANT
SUT VIC AB 1 CT1 27 (SUTURE) ×1
SUT VIC AB 1 CT1 27XBRD ANBCTR (SUTURE) ×1 IMPLANT
SUT VIC AB 1 CT1 8-18 (SUTURE) ×1
SUT VIC AB 2-0 CT1 18 (SUTURE) ×2 IMPLANT
SYR BULB IRRIGATION 50ML (SYRINGE) ×4 IMPLANT
SYR CONTROL 10ML LL (SYRINGE) ×2 IMPLANT
TOWEL GREEN STERILE (TOWEL DISPOSABLE) ×2 IMPLANT
TOWEL GREEN STERILE FF (TOWEL DISPOSABLE) ×2 IMPLANT
WATER STERILE IRR 1000ML POUR (IV SOLUTION) ×2 IMPLANT
YANKAUER SUCT BULB TIP NO VENT (SUCTIONS) IMPLANT

## 2018-03-24 NOTE — Anesthesia Procedure Notes (Signed)
Procedure Name: Intubation Date/Time: 03/24/2018 8:47 AM Performed by: Jodell Cipro, CRNA Pre-anesthesia Checklist: Patient identified, Emergency Drugs available, Suction available and Patient being monitored Patient Re-evaluated:Patient Re-evaluated prior to induction Oxygen Delivery Method: Circle System Utilized Preoxygenation: Pre-oxygenation with 100% oxygen Induction Type: IV induction Ventilation: Mask ventilation without difficulty Laryngoscope Size: Glidescope and 4 Grade View: Grade I Tube type: Oral Tube size: 7.0 mm Number of attempts: 1 Airway Equipment and Method: Stylet and Oral airway Placement Confirmation: ETT inserted through vocal cords under direct vision,  positive ETCO2 and breath sounds checked- equal and bilateral Secured at: 19 cm Tube secured with: Tape Dental Injury: Teeth and Oropharynx as per pre-operative assessment  Comments: Pt c/o pain in neck and occasional numbness in L arm during preop interview. Dr. Shon Baton aware. Glidescope used without issue and head maintained in neutral position to patient comfort prior to induction.

## 2018-03-24 NOTE — Discharge Instructions (Signed)
Lumbar Diskectomy, Care After °Refer to this sheet in the next few weeks. These instructions provide you with information about caring for yourself after your procedure. Your health care provider may also give you more specific instructions. Your treatment has been planned according to current medical practices, but problems sometimes occur. Call your health care provider if you have any problems or questions after your procedure. °What can I expect after the procedure? °After the procedure, it is common to have: °· Pain. °· Numbness. °· Weakness. ° °Follow these instructions at home: °Medicines °· Take medicines only as directed by your health care provider. °· If you were prescribed an antibiotic medicine, finish all of it even if you start to feel better. °Incision care °· There are many different ways to close and cover an incision, including stitches (sutures), skin glue, and adhesive strips. Follow your health care provider's instructions about: °? Incision care. °? Bandage (dressing) changes and removal. °? Incision closure removal. °· Check your incision area every day for signs of infection. If you cannot see your incision, have someone check it for you. Watch for: °? Redness, swelling, or pain. °? Fluid, blood, or pus. °Activity °· Avoid sitting for longer than 20 minutes at a time or as directed by your health care provider. °· Do not climb stairs more than once each day until your health care provider approves. °· Do not bend at your waist. To pick things up, bend your knees. °· Do not lift anything that is heavier than 10 lb (4.5 kg) or as directed by your health care provider. °· Do not drive a car until your health care provider approves. °· Ask your health care provider when you may return to your normal activities, such as playing sports and going back to work. °· Work with your physical therapist to learn safe movement and exercises to help healing. Do these exercises as directed. °· Take short  walks often. °General instructions °· Do not use any tobacco products, including cigarettes, chewing tobacco, or electronic cigarettes. If you need help quitting, ask your health care provider. °· Follow your health care provider’s instructions about bathing. Do not take baths, shower, swim, or use a hot tub until your health care provider approves. °· Wear your back brace as directed by your health care provider. °· To prevent constipation: °? Drink enough fluid to keep your urine clear or pale yellow. °? Eat plenty of fruits, vegetables, and whole grains. °· Keep all follow-up visits as directed by your health care provider. This is important. This includes any follow-up visits with your physical therapist. °Contact a health care provider if: °· You have a fever. °· You have redness, swelling, or pain in your incision area. °· Your pain is not controlled with medicine. °· You have pain, numbness, or weakness that lasts longer than three weeks after surgery. °· You become constipated. °Get help right away if: °· You have fluid, blood, or pus coming from your incision. °· You have increasing pain, numbness, or weakness. °· You lose control of when you urinate or have a bowel movement (incontinence). °· You have chest pain. °· You have trouble breathing. °This information is not intended to replace advice given to you by your health care provider. Make sure you discuss any questions you have with your health care provider. °Document Released: 04/30/2004 Document Revised: 11/01/2015 Document Reviewed: 01/18/2014 °Elsevier Interactive Patient Education © 2018 [employer] ° °

## 2018-03-24 NOTE — Op Note (Signed)
Operative report  Preoperative stenosis:  Right L5-S1 disc herniation.  Previous right L4-5 discectomy decompression.  Postoperative diagnosis: Same  Operative procedure: Right L5-S1 decompression discectomy.  First assistant: Anette Riedel, PA  Complications: None  Indications: Robyn Mayo is a pleasant 72 year old woman who presents with significant right radicular S1 nerve pain.  Several years ago she had a previous L4-5 decompression discectomy on the right side and noted improvement in her radicular leg pain.  She returned to after noting significant increasing radicular right leg pain.  Imaging studies demonstrated an L5-S1 disc herniation with lateral recess stenosis.  Attempts at conservative management had failed and so she elected to proceed with surgery.  All appropriate risks benefits and alternatives were discussed with the patient.  Operative report: Patient was brought the operating room placed by the operating room table.  After successful induction of general anesthesia and endotracheally patient teds SCDs were applied she was turned prone onto the Wilson frame.  All bony prominences well-padded and the back was prepped and draped in a standard fashion.  The previous incision was marked out and I placed 2 needles into the lower aspect of the incision and took an intraoperative x-ray.  Once I confirmed this with the L5-S1 space I infiltrated the proposed incision site with quarter percent Marcaine with epinephrine.  Midline incision was made and sharp dissection was carried out down to the deep fascia.  I incised the deep fascia on the right-hand side and began bluntly dissecting until I could feel the spinous process and lamina of S1.  Once I found the bony landmarks I proceeded superiorly until I could identify the L5.  There is a significant amount of scar tissue noted which I was able to sharply dissect through.  I then dissected out lateral to the facet.  I placed a Penfield 4 underneath  the L5 lamina and took a second x-ray to confirm I was at the appropriate level.  Once confirmed I then continued with the surgical approach.  A Taylor retractor was placed on the lateral aspect of the facet joint which allowed for visualization of the posterior lateral aspect of the spine.  I then used a 3 mm Kerrison rongeur to perform a generous laminotomy of L5.  I used a 2 mm Kerrison Roger to resect the leading edge of the S1 lamina.  I then performed a facetectomy laterally using a 2 and 3 mm Kerrison punch.  I took down the medial portion of the inferior L5 and superior S1 facets.  At this point I then gently dissected through the ligamentum flavum with a Penfield 4 and then used my Kerrison Roger to resect the ligamentum flavum and exposed the lateral aspect of the thecal sac.  I used my Penfield for to dissect into the lateral recess and the 2 mm Kerrison Roger to complete my lateral recess decompression and medial facetectomy.  I could now identify the S1 nerve root.  It was noted to be under tension and dorsally displaced from the disc fragment.  I slowly mobilized the thecal sac and S1 nerve root medially until I had a better visualization of the disc.  Epidural veins were isolated and coagulated.  Annulotomy was performed with a 15 blade scalpel and then using a nerve hook I mobilized and resected the disc fragment.  2 large pieces of disc material were removed.  Once this occurred the nerve root was now much more mobile.  There is no longer dorsally displaced and I could  easily track into the S1 foramen with a Public house manager.  I continue to remove a few more small fragments of disc material.  I then made sure there were no free fragments of disc material still within the disc space itself.  At this point I could circumferentially sweep with my [facility] over the annulus medially inferiorly along the S1 nerve root into the S1 foramen and superiorly into the lateral recess.  There was no  significant compression of the nerve root.  There is also no significant central compression at this point I irrigated and Pepcid normal saline and used bipolar electrocautery and FloSeal to obtain and maintain hemostasis.  After final irrigation thrombin-soaked Gelfoam patty was placed over the laminotomy site and I closed in a layered fashion with interrupted #1 Vicryl suture, 2-0 Vicryl suture, 3-0 Monocryl.  Steri-Strips and dry dressings were applied.  The patient was ultimately extubated transfer the PACU without incident.  The end of the case all needle sponge counts were correct.  There are no adverse intraoperative events.  Should be noted at the conclusion of the procedure there was no evidence of any CSF leak.

## 2018-03-24 NOTE — Progress Notes (Signed)
Pharmacy Antibiotic Note  Robyn Mayo is a 72 y.o. female admitted on 03/24/2018 s/p Right Lumbar Five-Sacral One disectomy (Right).  Pharmacy has been consulted for Vancomycin dosing for surgical prophylaxis. No drains noted.   Plan: Vancomycin 750mg  IV x 1 12 hour after preop-dose  Height: 5\' 1"  (154.9 cm) Weight: 171 lb 8.3 oz (77.8 kg) IBW/kg (Calculated) : 47.8  Temp (24hrs), Avg:97.7 F (36.5 C), Min:97.3 F (36.3 C), Max:97.8 F (36.6 C)  No results for input(s): WBC, CREATININE, LATICACIDVEN, VANCOTROUGH, VANCOPEAK, VANCORANDOM, GENTTROUGH, GENTPEAK, GENTRANDOM, TOBRATROUGH, TOBRAPEAK, TOBRARND, AMIKACINPEAK, AMIKACINTROU, AMIKACIN in the last 168 hours.  Estimated Creatinine Clearance: 53.1 mL/min (by C-G formula based on SCr of 0.93 mg/dL).    Allergies  Allergen Reactions  . Albuterol Other (See Comments)    BRONCHOSPASMS  . Sulfa Antibiotics     UNSPECIFIED CHILDHOOD REACTION   . Codeine Nausea Only and Other (See Comments)    Lightheaded  . Penicillins Rash and Other (See Comments)    CHILDHOOD REACTION  Has patient had a PCN reaction causing immediate rash, facial/tongue/throat swelling, SOB or lightheadedness with hypotension: Unknown Has patient had a PCN reaction causing severe rash involving mucus membranes or skin necrosis: Unknown Has patient had a PCN reaction that required hospitalization: No Has patient had a PCN reaction occurring within the last 10 years: No If all of the above answers are "NO", then may proceed with Cephalosporin use.   . Tramadol Nausea Only    Nolita Kutter A. Jeanella Craze, PharmD, BCPS Clinical Pharmacist [place] Pager: [phone] Please utilize Amion for appropriate phone number to reach the unit pharmacist [facility])   03/24/2018 2:15 PM

## 2018-03-24 NOTE — Brief Op Note (Signed)
03/24/2018  11:41 AM  PATIENT:  Robyn Mayo  72 y.o. female  PRE-OPERATIVE DIAGNOSIS:  Lumbar Five-Sacral One right Herniated nucleus pulposus   POST-OPERATIVE DIAGNOSIS:  Lumbar Five-Sacral One right Herniated nucleus pulposus   PROCEDURE:  Procedure(s) with comments: Right Lumbar Five-Sacral One disectomy (Right) - Right Lumbar Five-Sacral One disectomy  SURGEON:  Surgeon(s) and Role:    Venita Lick, MD - Primary  PHYSICIAN ASSISTANT:   ASSISTANTS: Carmen Mayo   ANESTHESIA:   general  EBL:  100 mL   BLOOD ADMINISTERED:none  DRAINS: none   LOCAL MEDICATIONS USED:  MARCAINE     SPECIMEN:  No Specimen  DISPOSITION OF SPECIMEN:  N/A  COUNTS:  YES  TOURNIQUET:  * No tourniquets in log *  DICTATION: .Dragon Dictation  PLAN OF CARE: Admit for overnight observation  PATIENT DISPOSITION:  PACU - hemodynamically stable.

## 2018-03-24 NOTE — Progress Notes (Signed)
Report given to philip lopez rn as caregiver 

## 2018-03-24 NOTE — Transfer of Care (Signed)
Immediate Anesthesia Transfer of Care Note  Patient: Robyn Mayo  Procedure(s) Performed: Right Lumbar Five-Sacral One disectomy (Right )  Patient Location: PACU  Anesthesia Type:General  Level of Consciousness: awake, alert  and oriented  Airway & Oxygen Therapy: Patient Spontanous Breathing and Patient connected to nasal cannula oxygen  Post-op Assessment: Report given to RN, Post -op Vital signs reviewed and stable and Patient moving all extremities X 4  Post vital signs: Reviewed and stable  Last Vitals:  Vitals Value Taken Time  BP 83/62 03/24/2018 11:31 AM  Temp    Pulse 85 03/24/2018 11:35 AM  Resp 16 03/24/2018 11:35 AM  SpO2 96 % 03/24/2018 11:35 AM  Vitals shown include unvalidated device data.  Last Pain:  Vitals:   03/24/18 0723  TempSrc: Oral         Complications: No apparent anesthesia complications

## 2018-03-25 ENCOUNTER — Encounter (HOSPITAL_COMMUNITY): Payer: Self-pay | Admitting: Orthopedic Surgery

## 2018-03-25 DIAGNOSIS — M5127 Other intervertebral disc displacement, lumbosacral region: Secondary | ICD-10-CM | POA: Diagnosis not present

## 2018-03-25 LAB — GLUCOSE, CAPILLARY
Glucose-Capillary: 151 mg/dL — ABNORMAL HIGH (ref 70–99)
Glucose-Capillary: 162 mg/dL — ABNORMAL HIGH (ref 70–99)

## 2018-03-25 MED ORDER — HYDROXYZINE HCL 50 MG/ML IM SOLN
50.0000 mg | Freq: Four times a day (QID) | INTRAMUSCULAR | Status: DC | PRN
  Administered 2018-03-25: 50 mg via INTRAMUSCULAR
  Filled 2018-03-25: qty 1

## 2018-03-25 MED FILL — Thrombin (Recombinant) For Soln 20000 Unit: CUTANEOUS | Qty: 1 | Status: AC

## 2018-03-25 NOTE — Evaluation (Signed)
Physical Therapy Evaluation Patient Details Name: Robyn Mayo MRN: [id number] DOB: [birth year] Today's Date: 03/25/2018   History of Present Illness  Pt is a 72 y/o female s/p R L5-S1 decompression discectomy. PHM including but not limited to COPD, DM and HTN.  Clinical Impression  Pt presented sitting on toilet in bathroom, awake and willing to participate in therapy session. Prior to admission, pt reported that she was independent with all functional mobility and ADLs. Pt lives alone but will have her children available to assist 24/7 upon d/c. Pt currently able to perform transfers with supervision and ambulate in hallway with min guard and use of RW. Pt very limited this session secondary to fatigue and nausea. Pt's RN was notified. PT reviewed 3/3 back precautions with pt throughout. PT also discussed generalized walking program for pt to initiate upon d/c. Pt expressed understanding. No further acute PT needs identified at this time. PT signing off.    Follow Up Recommendations Home health PT;Supervision/Assistance - 24 hour    Equipment Recommendations  Rolling walker with 5" wheels    Recommendations for Other Services       Precautions / Restrictions Precautions Precautions: Back Precaution Booklet Issued: Yes (comment) Precaution Comments: Reviewed 3/3 back precautions with pt throughout Required Braces or Orthoses: Spinal Brace Spinal Brace: Lumbar corset;Applied in sitting position Restrictions Weight Bearing Restrictions: No      Mobility  Bed Mobility               General bed mobility comments: pt OOB on toilet upon arrival  Transfers Overall transfer level: Needs assistance Equipment used: Rolling walker (2 wheeled) Transfers: Sit to/from Stand Sit to Stand: Supervision         General transfer comment: supervision for safety  Ambulation/Gait Ambulation/Gait assistance: Min guard Gait Distance (Feet): 100 Feet Assistive device: Rolling walker (2  wheeled) Gait Pattern/deviations: Step-through pattern;Decreased step length - right;Decreased step length - left;Decreased stride length Gait velocity: decreased   General Gait Details: pt with mild instability but no overt LOB or need for physical assistance, min guard for safety; pt limited secondary to fatigue and nausea  Stairs            Wheelchair Mobility    Modified Rankin (Stroke Patients Only)       Balance Overall balance assessment: Needs assistance Sitting-balance support: Feet supported Sitting balance-Leahy Scale: Good     Standing balance support: Single extremity supported;Bilateral upper extremity supported Standing balance-Leahy Scale: Poor                               Pertinent Vitals/Pain Pain Assessment: Faces Faces Pain Scale: Hurts little more Pain Location: incisional and right leg Pain Descriptors / Indicators: Aching;Sore Pain Intervention(s): Monitored during session;Repositioned    Home Living Family/patient expects to be discharged to:: Private residence Living Arrangements: Other relatives;Non-relatives/Friends Available Help at Discharge: Family;Available 24 hours/day;Friend(s) Type of Home: House Home Access: Stairs to enter   [employer] of Steps: 1 and 1 Home Layout: One level Home Equipment: Walker - 2 wheels;Bedside commode;Shower seat Additional Comments: says she will have hand held shower put in    Prior Function Level of Independence: Independent               Hand Dominance        Extremity/Trunk Assessment   Upper Extremity Assessment Upper Extremity Assessment: Overall WFL for tasks assessed    Lower Extremity Assessment  Lower Extremity Assessment: Generalized weakness    Cervical / Trunk Assessment Cervical / Trunk Assessment: Other exceptions Cervical / Trunk Exceptions: s/p lumbar sx  Communication   Communication: No difficulties  Cognition Arousal/Alertness:  Awake/alert Behavior During Therapy: Flat affect Overall Cognitive Status: Within Functional Limits for tasks assessed                                        General Comments      Exercises     Assessment/Plan    PT Assessment All further PT needs can be met in the next venue of care  PT Problem List Decreased activity tolerance;Decreased balance;Decreased mobility;Decreased coordination;Decreased knowledge of use of DME;Decreased safety awareness;Decreased knowledge of precautions       PT Treatment Interventions      PT Goals (Current goals can be found in the Care Plan section)  Acute Rehab PT Goals Patient Stated Goal: to go home today    Frequency     Barriers to discharge        Co-evaluation               AM-PAC PT "6 Clicks" Daily Activity  Outcome Measure Difficulty turning over in bed (including adjusting bedclothes, sheets and blankets)?: A Little Difficulty moving from lying on back to sitting on the side of the bed? : A Little Difficulty sitting down on and standing up from a chair with arms (e.g., wheelchair, bedside commode, etc,.)?: Unable Help needed moving to and from a bed to chair (including a wheelchair)?: None Help needed walking in hospital room?: A Little Help needed climbing 3-5 steps with a railing? : A Little 6 Click Score: 17    End of Session Equipment Utilized During Treatment: Back brace Activity Tolerance: Patient limited by fatigue Patient left: in chair;with call bell/phone within reach Nurse Communication: Mobility status PT Visit Diagnosis: Other abnormalities of gait and mobility (R26.89);Pain Pain - part of body: (back)    Time: 4650-3546 PT Time Calculation (min) (ACUTE ONLY): 18 min   Charges:   PT Evaluation $PT Eval Low Complexity: 1 Low          Sherie Don, PT, DPT  Acute Rehabilitation Services Pager [phone] Office [place] 03/25/2018, 1:56 PM

## 2018-03-25 NOTE — Progress Notes (Signed)
Patient is discharged from room 3C07 at this time. Alert and in stable condition. IV site d/c'd and instructions read to patient and family with understanding verbalized. Left unit via wheelchair with all belongings at side.  

## 2018-03-25 NOTE — Care Management Note (Signed)
Case Management Note  Patient Details  Name: Robyn Mayo MRN: [id number] Date of Birth: 10/16/1945  Subjective/Objective:  Right Lumbar5-S1 disectomy                  Action/Plan: NCM spoke to pt and offered choice for HH/list provided. Pt agreeable to [facility] for HHPT. [facility] with new referral. Pt requesting RW and tub bench for home. Contacted [facility] for DME to be delivered to room prior to dc.   Expected Discharge Date:  03/25/18               Expected Discharge Plan:  Home w Home Health Services  In-House Referral:  NA  Discharge planning Services  CM Consult  Post Acute Care Choice:  Home Health Choice offered to:  Patient  DME Arranged:  Walker rolling, Tub bench DME Agency:  [facility]  HH Arranged:  PT HH Agency:  [facility]  Status of Service:  Completed, signed off  If discussed at Long Length of Stay Meetings, dates discussed:    Additional Comments:  Elliot Cousin, RN 03/25/2018, 11:23 AM

## 2018-03-25 NOTE — Progress Notes (Signed)
    Subjective: 1 Day Post-Op Procedure(s) (LRB): Right Lumbar Five-Sacral One disectomy (Right) Patient reports pain as 7 on 0-10 scale.  Just got pain medication Denies CP or SOB.  Voiding without difficulty. Positive flatus.  Decreased leg pain Objective: Vital signs in last 24 hours: Temp:  [97.3 F (36.3 C)-98.6 F (37 C)] 98.6 F (37 C) (10/17 0704) Pulse Rate:  [75-105] 95 (10/17 0704) Resp:  [9-25] 18 (10/17 0704) BP: (111-138)/(56-88) 132/76 (10/17 0704) SpO2:  [93 %-100 %] 100 % (10/17 0704)  Intake/Output from previous day: 10/16 0701 - 10/17 0700 In: 1890 [P.O.:240; I.V.:1650] Out: 400 [Emesis/NG output:300; Blood:100] Intake/Output this shift: No intake/output data recorded.  Labs: No results for input(s): HGB in the last 72 hours. No results for input(s): WBC, RBC, HCT, PLT in the last 72 hours. No results for input(s): NA, K, CL, CO2, BUN, CREATININE, GLUCOSE, CALCIUM in the last 72 hours. No results for input(s): LABPT, INR in the last 72 hours.  Physical Exam: Neurologically intact ABD soft Sensation intact distally Dorsiflexion/Plantar flexion intact Incision: scant drainage Compartment soft Body mass index is 32.41 kg/m.   Assessment/Plan: 1 Day Post-Op Procedure(s) (LRB): Right Lumbar Five-Sacral One disectomy (Right) Advance diet Up with therapy Plan for DC home today after cleared by PT  Mayo, Baxter Kail for Dr. Venita Lick [facility] [phone] 03/25/2018, 7:24 AM

## 2018-03-25 NOTE — Evaluation (Addendum)
Occupational Therapy Evaluation Patient Details Name: Robyn Mayo MRN: [id number] DOB: [birth year] Today's Date: 03/25/2018    History of Present Illness Right L5-S1 decompression discectomy. PHMx:  Previous right L4-5 discectomy decompression.   Clinical Impression   This 72 yo female admitted and underwent above presents to acute OT with increased pain, decreased balance, and back precautions all affecting safety and independence with basic ADLs. She will benefit from one more session of OT before D/C (I have given RN my number to page me when family arrives). I want family there to hear about basic ADLs as well.    Follow Up Recommendations  Home health OT;Supervision/Assistance - 24 hour    Equipment Recommendations  None recommended by OT       Precautions / Restrictions Precautions Precautions: Back Precaution Booklet Issued: Yes (comment) Required Braces or Orthoses: Spinal Brace Spinal Brace: Lumbar corset;Applied in sitting position Restrictions Weight Bearing Restrictions: No      Mobility Bed Mobility Overal bed mobility: Needs Assistance Bed Mobility: Rolling;Sidelying to Sit;Sit to Sidelying Rolling: Supervision Sidelying to sit: Supervision     Sit to sidelying: Supervision General bed mobility comments: VCs for technique  Transfers Overall transfer level: Needs assistance Equipment used: Rolling walker (2 wheeled) Transfers: Sit to/from Stand Sit to Stand: Min assist              Balance Overall balance assessment: Needs assistance Sitting-balance support: No upper extremity supported;Feet supported Sitting balance-Leahy Scale: Fair     Standing balance support: During functional activity;No upper extremity supported Standing balance-Leahy Scale: Fair Standing balance comment: pull up LB clothing                           ADL either performed or assessed with clinical judgement   ADL Overall ADL's : Needs  assistance/impaired Eating/Feeding: Independent;Sitting   Grooming: Set up;Supervision/safety;Standing Grooming Details (indicate cue type and reason): Educated on not bending over the sink for mouth care (use 2 cups, one to spit and one to rinse) Upper Body Bathing: Supervision/ safety;Set up;Sitting   Lower Body Bathing: Sit to/from stand;With adaptive equipment;Adhering to back precautions;Minimal assistance   Upper Body Dressing : Set up;Supervision/safety;Sitting   Lower Body Dressing: Sit to/from stand;Adhering to back precautions;With adaptive equipment;Minimal assistance   Toilet Transfer: Ambulation;RW;BSC;Minimal assistance   Toileting- Clothing Manipulation and Hygiene: Sit to/from stand;Minimal assistance Toileting - Clothing Manipulation Details (indicate cue type and reason): Educated on use of wet wipes to help from twisting as much             Vision Patient Visual Report: No change from baseline              Pertinent Vitals/Pain Pain Assessment: 0-10 Pain Score: 7  Pain Location: incisional and right leg Pain Descriptors / Indicators: Aching;Sore Pain Intervention(s): Limited activity within patient's tolerance;Monitored during session;Repositioned     Hand Dominance Right   Extremity/Trunk Assessment Upper Extremity Assessment Upper Extremity Assessment: Overall WFL for tasks assessed   Lower Extremity Assessment Lower Extremity Assessment: Defer to PT evaluation       Communication Communication Communication: No difficulties   ognition Arousal/Alertness: Awake/alert Behavior During Therapy: WFL for tasks assessed/performed Overall Cognitive Status: Within Functional Limits for tasks assessed  Home Living Family/patient expects to be discharged to:: Private residence Living Arrangements: Other relatives;Non-relatives/Friends Available Help at Discharge: Family;Available 24  hours/day;Friend(s) Type of Home: House Home Access: Stairs to enter [employer] of Steps: 1 and 1   Home Layout: One level     Bathroom Shower/Tub: Tub/shower unit;Curtain   Firefighter: Standard     Home Equipment: Environmental consultant - 2 wheels;Bedside commode;Shower seat   Additional Comments: says she will have hand held shower put in      Prior Functioning/Environment Level of Independence: Independent                 OT Problem List: Decreased strength;Decreased range of motion;Impaired balance (sitting and/or standing);Pain;Decreased knowledge of precautions;Decreased knowledge of use of DME or AE      OT Treatment/Interventions: Self-care/ADL training;Balance training;DME and/or AE instruction;Patient/family education    OT Goals(Current goals can be found in the care plan section) Acute Rehab OT Goals Patient Stated Goal: to go home today OT Goal Formulation: With patient Time For Goal Achievement: 04/01/18 Potential to Achieve Goals: Good ADL Goals Pt Will Perform Grooming: (pt will be able to state how she needs to do mouth care) Pt Will Transfer to Toilet: with min guard assist;ambulating;bedside commode Pt Will Perform Tub/Shower Transfer: with min assist;ambulating;rolling walker;shower seat  OT Frequency: Min 2X/week              AM-PAC PT "6 Clicks" Daily Activity     Outcome Measure Help from another person eating meals?: None Help from another person taking care of personal grooming?: A Little Help from another person toileting, which includes using toliet, bedpan, or urinal?: A Little Help from another person bathing (including washing, rinsing, drying)?: A Little Help from another person to put on and taking off regular upper body clothing?: A Little Help from another person to put on and taking off regular lower body clothing?: A Little 6 Click Score: 19   End of Session Equipment Utilized During Treatment: Rolling walker;Back  brace Nurse Communication: (pt with emesis)  Activity Tolerance: Other (comment)(emesis) Patient left: in bed;with call bell/phone within reach  OT Visit Diagnosis: Unsteadiness on feet (R26.81);Muscle weakness (generalized) (M62.81);Pain Pain - part of body: (right leg and incisional pain)                Time: 1610-9604 OT Time Calculation (min): 49 min Charges:  OT General Charges $OT Visit: 1 Visit OT Evaluation $OT Eval Moderate Complexity: 1 Mod OT Treatments $Self Care/Home Management : 23-37 mins  Ignacia Palma, OTR/L Acute [employer] Pager [phone] Office [phone]

## 2018-03-25 NOTE — Anesthesia Postprocedure Evaluation (Signed)
Anesthesia Post Note  Patient: Robyn Mayo  Procedure(s) Performed: Right Lumbar Five-Sacral One disectomy (Right )     Patient location during evaluation: PACU Anesthesia Type: General Level of consciousness: awake and alert Pain management: pain level controlled Vital Signs Assessment: post-procedure vital signs reviewed and stable Respiratory status: spontaneous breathing, nonlabored ventilation, respiratory function stable and patient connected to nasal cannula oxygen Cardiovascular status: blood pressure returned to baseline and stable Postop Assessment: no apparent nausea or vomiting Anesthetic complications: no    Last Vitals:  Vitals:   03/25/18 0704 03/25/18 1133  BP: 132/76 (!) 130/92  Pulse: 95 99  Resp: 18 20  Temp: 37 C   SpO2: 100% 98%    Last Pain:  Vitals:   03/25/18 1102  TempSrc:   PainSc: 4    Pain Goal: Patients Stated Pain Goal: 3 (03/25/18 0703)               Calib Wadhwa L Xochil Shanker

## 2018-03-25 NOTE — Care Management Obs Status (Signed)
MEDICARE OBSERVATION STATUS NOTIFICATION   Patient Details  Name: Robyn Mayo MRN: [id number] Date of Birth: [birth year]   Medicare Observation Status Notification Given:  Yes    Elliot Cousin, RN 03/25/2018, 11:25 AM

## 2018-03-26 NOTE — Discharge Summary (Signed)
Physician Discharge Summary  Patient ID: Robyn Mayo MRN: [id number] DOB/AGE: [birth year] 70 y.o.  Admit date: 03/24/2018 Discharge date: 03/25/18  Admission Diagnoses:  Lumbar five through sacral one right disc herniation  Discharge Diagnoses:  Active Problems:   Status post lumbar spine operative procedure for decompression of spinal cord   Past Medical History:  Diagnosis Date  . Anemia   . Arthritis   . Asthma   . COPD (chronic obstructive pulmonary disease) (HCC)   . Diabetes mellitus without complication (HCC)   . Hypertension     Surgeries: Procedure(s): Right Lumbar Five-Sacral One disectomy on 03/24/2018   Consultants (if any):   Discharged Condition: Improved  Hospital Course: Robyn Mayo is an 72 y.o. female who was admitted 03/24/2018 with a diagnosis of L5-S1 right disc herniation and went to the operating room on 03/24/2018 and underwent the above named procedures.  Post op day one pt reports moderate incisional pain.  She reports decreased leg pain.  Pt is voiding w/o difficulty. Pt was ambulating in hallway with use of Walker.  Pt was cleared by PT/OT to discharge.  Home health was recommended.   She was given perioperative antibiotics:  Anti-infectives (From admission, onward)   Start     Dose/Rate Route Frequency Ordered Stop   03/24/18 2000  vancomycin (VANCOCIN) IVPB 750 mg/150 ml premix     750 mg 150 mL/hr over 60 Minutes Intravenous  Once 03/24/18 1417 03/25/18 0745   03/24/18 0727  vancomycin (VANCOCIN) IVPB 1000 mg/200 mL premix     1,000 mg 200 mL/hr over 60 Minutes Intravenous 60 min pre-op 03/24/18 0727 03/25/18 0745    .  She was given sequential compression devices, early ambulation, and TED for DVT prophylaxis.  She benefited maximally from the hospital stay and there were no complications.    Recent vital signs:  Vitals:   03/25/18 0704 03/25/18 1133  BP: 132/76 (!) 130/92  Pulse: 95 99  Resp: 18 20  Temp: 98.6 F (37 C)    SpO2: 100% 98%    Recent laboratory studies:  Lab Results  Component Value Date   HGB 12.3 03/16/2018   Lab Results  Component Value Date   WBC 6.9 03/16/2018   PLT 273 03/16/2018   No results found for: INR Lab Results  Component Value Date   NA 142 03/16/2018   K 3.7 03/16/2018   CL 110 03/16/2018   CO2 23 03/16/2018   BUN 15 03/16/2018   CREATININE 0.93 03/16/2018   GLUCOSE 109 (H) 03/16/2018    Discharge Medications:   Allergies as of 03/25/2018      Reactions   Albuterol Other (See Comments)   BRONCHOSPASMS   Sulfa Antibiotics    UNSPECIFIED CHILDHOOD REACTION    Codeine Nausea Only, Other (See Comments)   Lightheaded   Penicillins Rash, Other (See Comments)   CHILDHOOD REACTION  Has patient had a PCN reaction causing immediate rash, facial/tongue/throat swelling, SOB or lightheadedness with hypotension: Unknown Has patient had a PCN reaction causing severe rash involving mucus membranes or skin necrosis: Unknown Has patient had a PCN reaction that required hospitalization: No Has patient had a PCN reaction occurring within the last 10 years: No If all of the above answers are "NO", then may proceed with Cephalosporin use.   Tramadol Nausea Only      Medication List    STOP taking these medications   acetaminophen 650 MG CR tablet Commonly known as:  TYLENOL  TAKE these medications   amLODipine 10 MG tablet Commonly known as:  NORVASC Take 10 mg by mouth daily.   cholecalciferol 1000 units tablet Commonly known as:  VITAMIN D Take 1,000 Units by mouth daily.   enalapril 20 MG tablet Commonly known as:  VASOTEC Take 20 mg by mouth daily.   fluticasone-salmeterol 115-21 MCG/ACT inhaler Commonly known as:  ADVAIR HFA Inhale 2 puffs into the lungs 2 (two) times daily.   gabapentin 100 MG capsule Commonly known as:  NEURONTIN Take 100 mg by mouth 3 (three) times daily.   metFORMIN 500 MG tablet Commonly known as:  GLUCOPHAGE Take 500 mg  by mouth 2 (two) times daily.   methocarbamol 500 MG tablet Commonly known as:  ROBAXIN Take 1 tablet (500 mg total) by mouth 3 (three) times daily.   montelukast 10 MG tablet Commonly known as:  SINGULAIR Take 10 mg by mouth at bedtime.   ondansetron 4 MG disintegrating tablet Commonly known as:  ZOFRAN-ODT Take 1 tablet (4 mg total) by mouth every 8 (eight) hours as needed for nausea or vomiting.   oxyCODONE-acetaminophen 5-325 MG tablet Commonly known as:  PERCOCET/ROXICET Take 1 tablet by mouth every 4 (four) hours as needed for up to 5 days for severe pain.   pioglitazone 30 MG tablet Commonly known as:  ACTOS Take 30 mg by mouth daily.   simvastatin 40 MG tablet Commonly known as:  ZOCOR Take 40 mg by mouth daily.   SOMBRA WARM THERAPY 3-3 % Gel Generic drug:  Menthol-Camphor Apply 1 application topically daily as needed (pain).       Diagnostic Studies: Dg Lumbar Spine 1 View  Result Date: 03/24/2018 CLINICAL DATA:  L5-S1 diskectomy, intraoperative localization EXAM: LUMBAR SPINE - 1 VIEW COMPARISON:  None. FINDINGS: Two posterior approach surgical localization devices, the superior of which terminates over the posterior facets at L4-5 disc level and inferior of which terminates over the facets at the L5-S1 disc level. IMPRESSION: Posterior approach surgical localization device positions as detailed. Electronically Signed   By: Delbert Phenix M.D.   On: 03/24/2018 12:32   Dg Spine Portable 1 View  Result Date: 03/24/2018 CLINICAL DATA:  Intraoperative localization film for patient undergoing L5-S1 discectomy. EXAM: PORTABLE SPINE - 1 VIEW COMPARISON:  None. FINDINGS: Intraoperative view of the lumbar spine lateral projection demonstrates a probe at the L5-S1 level. IMPRESSION: As above. Electronically Signed   By: Drusilla Kanner M.D.   On: 03/24/2018 09:43    Disposition:  Pt will present to clinic in 2 weeks Post op meds provided Discharge Instructions     Incentive spirometry RT   Complete by:  As directed       Follow-up Information    Venita Lick, MD Follow up in 2 week(s).   Specialty:  Orthopedic Surgery Contact information: [address] [place] [address] [phone]        [facility] Follow up.   Specialty:  Home Health Services Why:  Home Health Physical Therapy-agency will call to arrange appointment Contact information: [address] [place] [address] [phone]            Signed: Kirt Boys 03/26/2018, 2:24 PM

## 2018-11-17 ENCOUNTER — Emergency Department (HOSPITAL_BASED_OUTPATIENT_CLINIC_OR_DEPARTMENT_OTHER)
Admission: EM | Admit: 2018-11-17 | Discharge: 2018-11-17 | Disposition: A | Discharge status: Home / Self Care | Payer: Medicare Other | Attending: Emergency Medicine | Admitting: Emergency Medicine

## 2018-11-17 ENCOUNTER — Encounter (HOSPITAL_BASED_OUTPATIENT_CLINIC_OR_DEPARTMENT_OTHER): Payer: Self-pay

## 2018-11-17 ENCOUNTER — Emergency Department (HOSPITAL_BASED_OUTPATIENT_CLINIC_OR_DEPARTMENT_OTHER): Payer: Medicare Other

## 2018-11-17 ENCOUNTER — Other Ambulatory Visit: Payer: Self-pay

## 2018-11-17 DIAGNOSIS — S8001XA Contusion of right knee, initial encounter: Secondary | ICD-10-CM | POA: Diagnosis not present

## 2018-11-17 DIAGNOSIS — Y92008 Other place in unspecified non-institutional (private) residence as the place of occurrence of the external cause: Secondary | ICD-10-CM | POA: Insufficient documentation

## 2018-11-17 DIAGNOSIS — Y999 Unspecified external cause status: Secondary | ICD-10-CM | POA: Diagnosis not present

## 2018-11-17 DIAGNOSIS — Y9301 Activity, walking, marching and hiking: Secondary | ICD-10-CM | POA: Insufficient documentation

## 2018-11-17 DIAGNOSIS — W010XXA Fall on same level from slipping, tripping and stumbling without subsequent striking against object, initial encounter: Secondary | ICD-10-CM | POA: Insufficient documentation

## 2018-11-17 DIAGNOSIS — I10 Essential (primary) hypertension: Secondary | ICD-10-CM | POA: Diagnosis not present

## 2018-11-17 DIAGNOSIS — E119 Type 2 diabetes mellitus without complications: Secondary | ICD-10-CM | POA: Diagnosis not present

## 2018-11-17 DIAGNOSIS — F17210 Nicotine dependence, cigarettes, uncomplicated: Secondary | ICD-10-CM | POA: Insufficient documentation

## 2018-11-17 DIAGNOSIS — J449 Chronic obstructive pulmonary disease, unspecified: Secondary | ICD-10-CM | POA: Diagnosis not present

## 2018-11-17 DIAGNOSIS — S8991XA Unspecified injury of right lower leg, initial encounter: Secondary | ICD-10-CM | POA: Diagnosis present

## 2018-11-17 DIAGNOSIS — M25561 Pain in right knee: Secondary | ICD-10-CM

## 2018-11-17 DIAGNOSIS — Z79899 Other long term (current) drug therapy: Secondary | ICD-10-CM | POA: Insufficient documentation

## 2018-11-17 MED ORDER — ACETAMINOPHEN 325 MG PO TABS
650.0000 mg | ORAL_TABLET | Freq: Once | ORAL | Status: AC
  Administered 2018-11-17: 650 mg via ORAL
  Filled 2018-11-17: qty 2

## 2018-11-17 MED ORDER — LIDOCAINE 5 % EX PTCH: 1.0000 | MEDICATED_PATCH | CUTANEOUS | 0 refills | Status: AC

## 2018-11-17 NOTE — ED Notes (Signed)
Pt requested to stay in wheelchair. Pt reports tripping on door frame when entering the house today. Fell onto R knee. Pt has hx of femur fx/knee issues in past. Pt able to ambulate with cane but reports increased pain since fall. Pt put cream on knee PTA but unable to report name of medication. Pt reports relief for a while.

## 2018-11-17 NOTE — ED Notes (Signed)
Patient transported to X-ray 

## 2018-11-17 NOTE — ED Provider Notes (Signed)
[facility] EMERGENCY DEPARTMENT Provider Note   CSN: [id number] Arrival date & time: 11/17/18  1546    History   Chief Complaint Chief Complaint  Patient presents with  . Knee Injury    HPI Robyn Mayo is a 73 y.o. female with medical history of diabetes, COPD, asthma, arthritis, hypertension presenting to emergency department today with chief complaint of knee pain x1 day.  Patient states 3 hours prior to arrival she was walking in her house when she tripped over the door frame.  She landed on her left knee.  She was assisted to standing by her son who witnessed the fall and she was able to walk afterwards.  She states after sitting in the chair for a while she attempted to get up and had worsening right knee pain.  Pain is located in her knee and she describes it as constant aching.  She rates it 6 out of 10 in severity.  She did not take anything for pain prior to arrival.  She is not anticoagulated.  Denies any numbness, tingling, weakness, back pain.    Past Medical History:  Diagnosis Date  . Anemia   . Arthritis   . Asthma   . COPD (chronic obstructive pulmonary disease) (HCC)   . Diabetes mellitus without complication (HCC)   . Hypertension     Patient Active Problem List   Diagnosis Date Noted  . 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 BACK SURGERY    . EYE SURGERY Bilateral     as child  cross-eyed  . FOOT SURGERY Right    times 2  . KNEE ARTHROSCOPY Right   . LUMBAR LAMINECTOMY/DECOMPRESSION MICRODISCECTOMY Right 03/24/2018   Procedure: Right Lumbar Five-Sacral One disectomy;  Surgeon: Venita LickBrooks, Dahari, MD;  Location: [facility] OR;  Service: Orthopedics;  Laterality: Right;  Right Lumbar Five-Sacral One disectomy     OB History   No obstetric history on file.      Home Medications    Prior to Admission medications   Medication Sig Start Date  End Date Taking? Authorizing Provider  amLODipine (NORVASC) 10 MG tablet Take 10 mg by mouth daily. 12/22/17   [provider]  cholecalciferol (VITAMIN D) 1000 units tablet Take 1,000 Units by mouth daily.    [provider]  enalapril (VASOTEC) 20 MG tablet Take 20 mg by mouth daily. 12/22/17   [provider]  fluticasone-salmeterol (ADVAIR HFA) 115-21 MCG/ACT inhaler Inhale 2 puffs into the lungs 2 (two) times daily.    [provider]  gabapentin (NEURONTIN) 100 MG capsule Take 100 mg by mouth 3 (three) times daily. 01/25/18   [provider]  lidocaine (LIDODERM) 5 % Place 1 patch onto the skin daily. Remove & Discard patch within 12 hours or as directed by MD 11/17/18   Alvira MondaySchlossman, Erin, MD  Menthol-Camphor [facility](SOMBRA WARM THERAPY) 3-3 % GEL Apply 1 application topically daily as needed (pain).    [provider]  metFORMIN (GLUCOPHAGE) 500 MG tablet Take 500 mg by mouth 2 (two) times daily. 01/27/18   [provider]  methocarbamol (ROBAXIN) 500 MG tablet Take 1 tablet (500 mg total) by mouth 3 (three) times daily. 03/24/18   Mayo, Baxter Kailarmen Christina, PA-C  montelukast (SINGULAIR) 10 MG tablet Take 10 mg by mouth at bedtime. 01/25/18   [provider]  ondansetron (ZOFRAN ODT) 4 MG disintegrating tablet Take  1 tablet (4 mg total) by mouth every 8 (eight) hours as needed for nausea or vomiting. 03/24/18   Mayo, Darla Lesches, PA-C  pioglitazone (ACTOS) 30 MG tablet Take 30 mg by mouth daily. 02/26/18   [provider]  simvastatin (ZOCOR) 40 MG tablet Take 40 mg by mouth daily. 08/01/16   [provider]    Family History No family history on file.  Social History Social History   Tobacco Use  . Smoking status: Current Every Day Smoker    Packs/day: 0.50    Years: 50.00    Pack years: 25.00    Types: Cigarettes  . Smokeless tobacco: Never Used  Substance Use Topics  . Alcohol use: Yes    Comment:  rarely  . Drug use: Never     Allergies   Albuterol; Sulfa antibiotics; Codeine; Penicillins; and Tramadol   Review of Systems Review of Systems  Musculoskeletal: Positive for arthralgias.  Allergic/Immunologic: Positive for immunocompromised state (diabetes).  Neurological: Negative for weakness and numbness.     Physical Exam Updated Vital Signs BP 138/77 (BP Location: Right Arm)   Pulse 92   Temp 98.4 F (36.9 C) (Oral)   Resp 20   Ht 5\' 1"  (1.549 m)   Wt 79.4 kg   SpO2 99%   BMI 33.07 kg/m   Physical Exam Vitals signs and nursing note reviewed.  Constitutional:      Appearance: She is well-developed. She is not ill-appearing or toxic-appearing.  HENT:     Head: Normocephalic and atraumatic.     Nose: Nose normal.  Eyes:     General: No scleral icterus.       Right eye: No discharge.        Left eye: No discharge.     Conjunctiva/sclera: Conjunctivae normal.  Neck:     Musculoskeletal: Normal range of motion.     Vascular: No JVD.  Cardiovascular:     Rate and Rhythm: Normal rate and regular rhythm.     Pulses: Normal pulses.          Radial pulses are 2+ on the right side and 2+ on the left side.       Dorsalis pedis pulses are 2+ on the right side and 2+ on the left side.     Heart sounds: Normal heart sounds.  Pulmonary:     Effort: Pulmonary effort is normal.     Breath sounds: Normal breath sounds.  Abdominal:     General: There is no distension.  Musculoskeletal: Normal range of motion.     Comments: Decreased range of motion of right knee secondary to pain.  Bony tenderness to patella.  Anterior and posterior drawer tests are negative.  No overlying erythema or edema.  No open wound or skin tear. Neurovascularly intact distally. Compartments soft above and below affected joint.  Full ROM of right ankle, able to wiggle all toes.   Skin:    General: Skin is warm and dry.  Neurological:     Mental Status: She is oriented to person, place, and time.      GCS: GCS eye subscore is 4. GCS verbal subscore is 5. GCS motor subscore is 6.     Comments: Fluent speech, no facial droop.  Psychiatric:        Behavior: Behavior normal.      ED Treatments / Results  Labs (all labs ordered are listed, but only abnormal results are displayed) Labs Reviewed - No data to display  EKG None  Radiology Dg Knee Complete 4 Views Right  Result Date: 11/17/2018 CLINICAL DATA:  Pt states that she fell today and landed on the anterior side of right knee and is having pain around the patella region with limited ROM.injury EXAM: RIGHT KNEE - COMPLETE 4+ VIEW COMPARISON:  None. FINDINGS: No fracture of the proximal tibia or distal femur. Patella is normal. No joint effusion. Spurring of the patella. IMPRESSION: No fracture or dislocation. Electronically Signed   By: Genevive BiStewart  Edmunds M.D.   On: 11/17/2018 16:33    Procedures Procedures (including critical care time)  Medications Ordered in ED Medications  acetaminophen (TYLENOL) tablet 650 mg (650 mg Oral Given 11/17/18 1643)     Initial Impression / Assessment and Plan / ED Course  I have reviewed the triage vital signs and the nursing notes.  Pertinent labs & imaging results that were available during my care of the patient were reviewed by me and considered in my medical decision making (see chart for details).  Patient presents to the ED with complaints of pain to the right knee s/p mechanical fall. Exam without obvious deformity or open wounds. ROM intact. Tender to palpation of patella. NVI distally. Xray negative for fracture/dislocation. Pt ambulates with steady gait. Therapeutic splint provided. PRICE and tylenol recommended. I discussed results, treatment plan, need for follow-up, and return precautions with the patient. Provided opportunity for questions, patient confirmed understanding and are in agreement with plan.  Recommend PCP follow-up. The patient was discussed with and seen by Dr.  Dalene SeltzerSchlossman who agrees with the treatment plan.   This note was prepared using Dragon voice recognition software and may include unintentional dictation errors due to the inherent limitations of voice recognition software.   Final Clinical Impressions(s) / ED Diagnoses   Final diagnoses:  Acute pain of right knee  Contusion of right knee, initial encounter    ED Discharge Orders         Ordered    lidocaine (LIDODERM) 5 %  Every 24 hours     11/17/18 1722           Shatora Weatherbee, Caroleen HammanKaitlyn E, PA-C 11/17/18 2238    Alvira MondaySchlossman, Erin, MD 11/18/18 2211

## 2018-11-17 NOTE — ED Triage Notes (Signed)
Pt states she tripped/fell ~3 hours PTA-injured/pain right knee-to triage in w/c-NAD

## 2018-11-18 ENCOUNTER — Telehealth (HOSPITAL_BASED_OUTPATIENT_CLINIC_OR_DEPARTMENT_OTHER): Payer: Self-pay | Admitting: Emergency Medicine

## 2021-02-19 IMAGING — CR RIGHT KNEE - COMPLETE 4+ VIEW
4 series · 4 of 4 positions shown · non-contrast
Comparison: None.

CLINICAL DATA: Pt states that she fell today and landed on the
anterior side of right knee and is having pain around the patella
region with limited ROM.injury

EXAM:
RIGHT KNEE - COMPLETE 4+ VIEW

[t knee ap right]
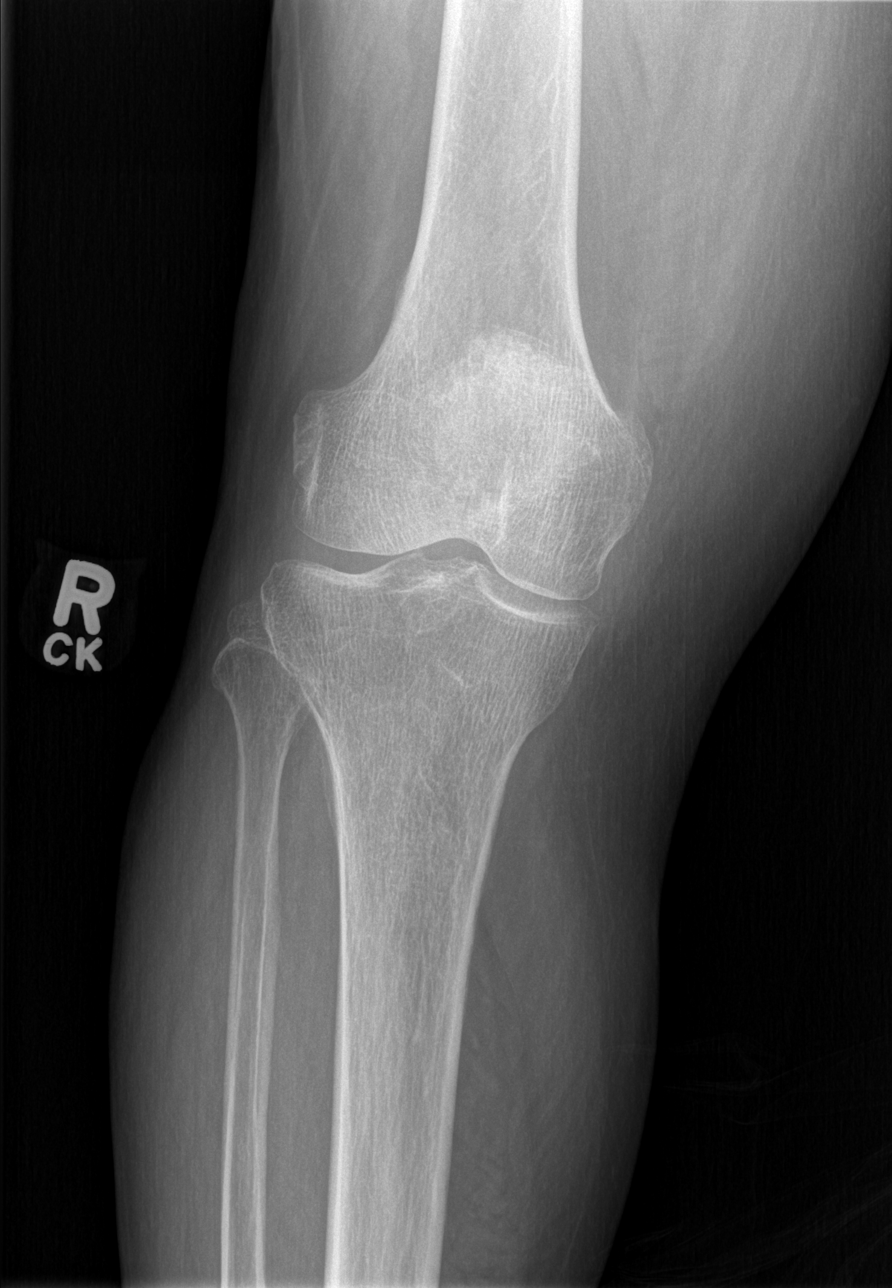

[t knee oblique right (1 of 2)]
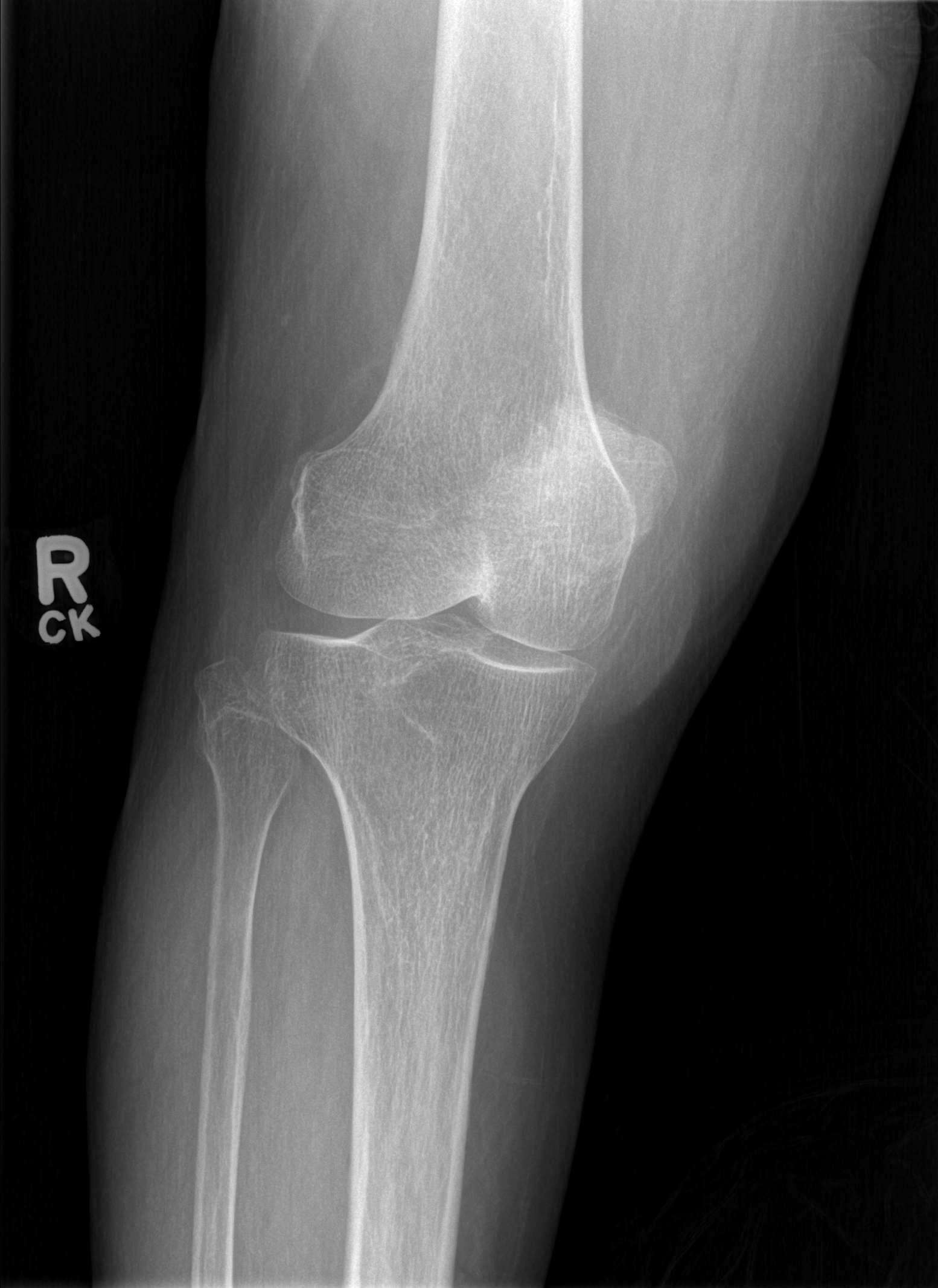

[t knee oblique right (2 of 2)]
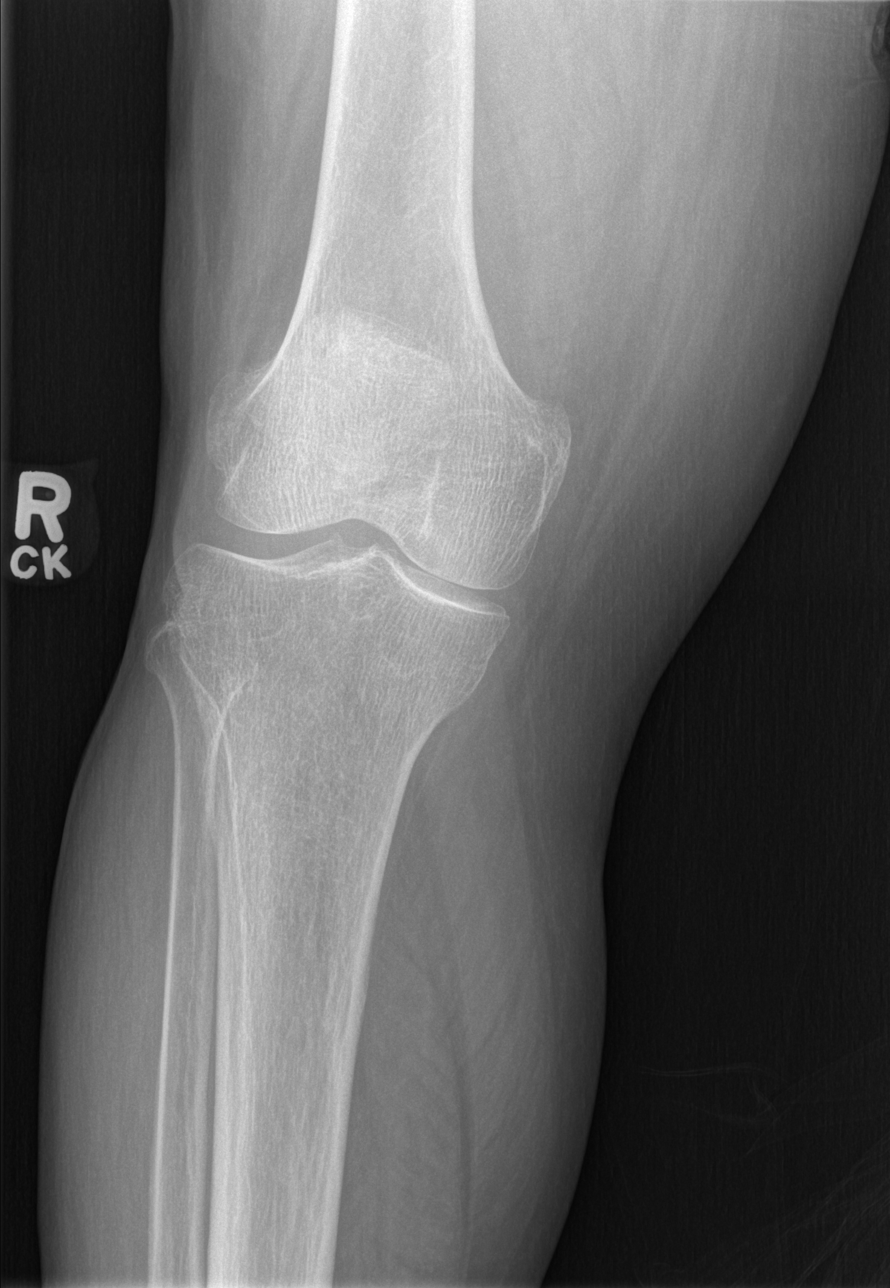

[t knee lat right]
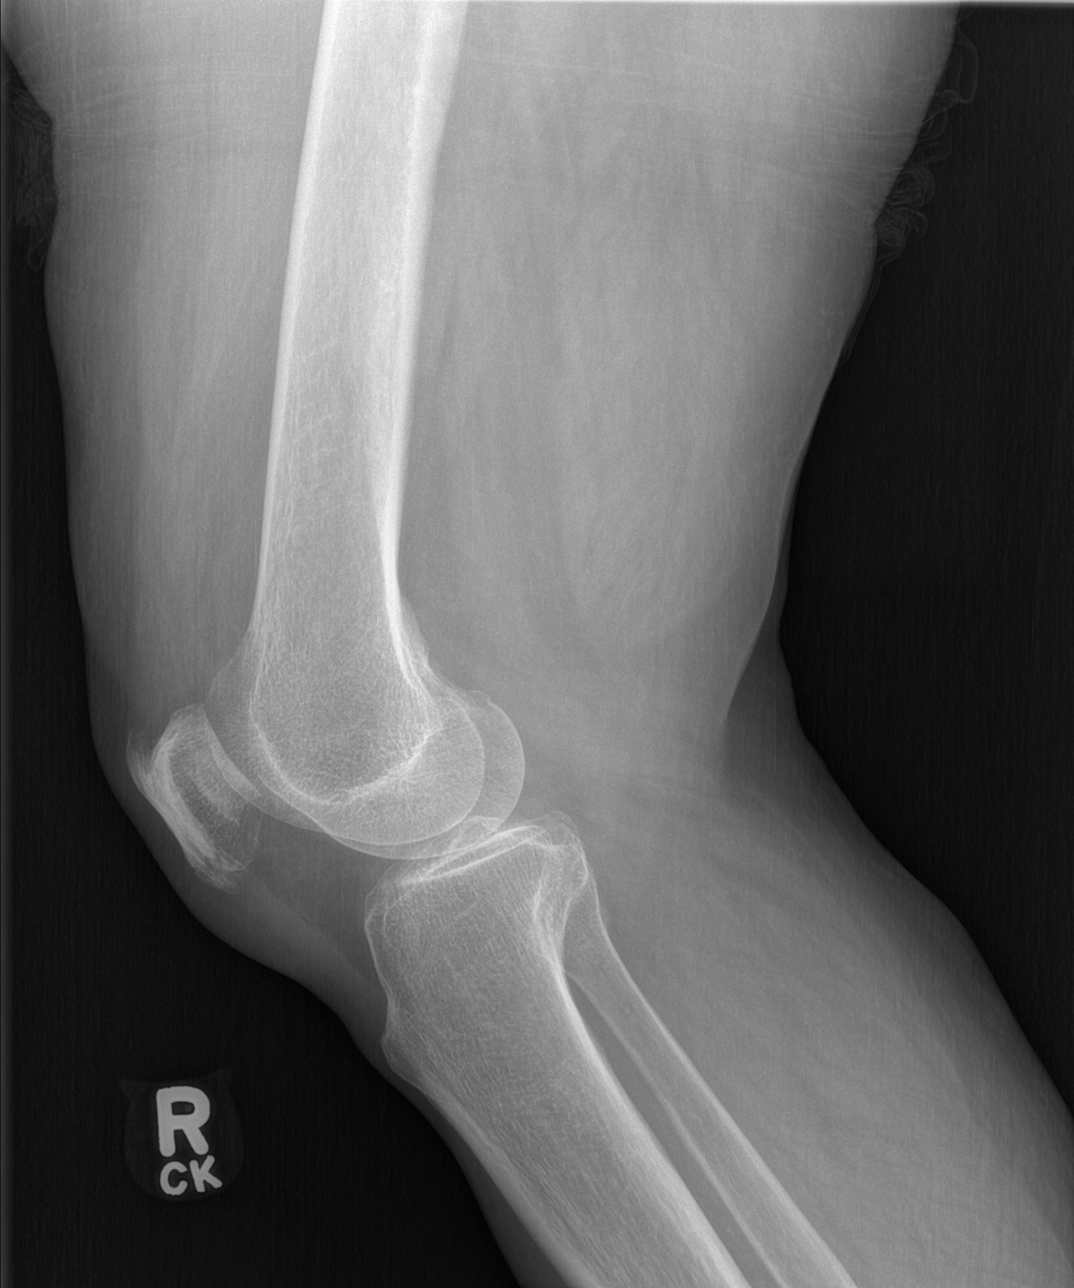

[4 of 4 positions shown; findings below may reference images not displayed]

FINDINGS: No fracture of the proximal tibia or distal femur. Patella is
normal. No joint effusion. Spurring of the patella.
IMPRESSION: No fracture or dislocation.

## 2024-02-05 ENCOUNTER — Encounter: Payer: Self-pay | Admitting: Orthopedic Surgery

## 2024-02-09 ENCOUNTER — Other Ambulatory Visit (HOSPITAL_BASED_OUTPATIENT_CLINIC_OR_DEPARTMENT_OTHER): Payer: Self-pay | Admitting: Orthopedic Surgery

## 2024-02-09 DIAGNOSIS — M8580 Other specified disorders of bone density and structure, unspecified site: Secondary | ICD-10-CM
